# Patient Record
Sex: Male | Born: 1965 | Race: White | Hispanic: No | Marital: Married | Smoking: Former smoker
Health system: Southern US, Community
[De-identification: ages and names within clinical notes are randomized; demographics above are authoritative.]

## PROBLEM LIST (undated history)

## (undated) DIAGNOSIS — I2111 ST elevation (STEMI) myocardial infarction involving right coronary artery: Secondary | ICD-10-CM

## (undated) DIAGNOSIS — E785 Hyperlipidemia, unspecified: Secondary | ICD-10-CM

## (undated) DIAGNOSIS — Z9001 Acquired absence of eye: Secondary | ICD-10-CM

## (undated) DIAGNOSIS — Z955 Presence of coronary angioplasty implant and graft: Secondary | ICD-10-CM

## (undated) HISTORY — DX: Presence of coronary angioplasty implant and graft: Z95.5

## (undated) HISTORY — DX: Acquired absence of eye: Z90.01

## (undated) HISTORY — DX: Hyperlipidemia, unspecified: E78.5

## (undated) HISTORY — DX: ST elevation (STEMI) myocardial infarction involving right coronary artery: I21.11

---

## 2005-01-17 ENCOUNTER — Ambulatory Visit (HOSPITAL_COMMUNITY): Admission: RE | Admit: 2005-01-17 | Discharge: 2005-01-17 | Payer: Self-pay | Admitting: Orthopedic Surgery

## 2005-03-25 ENCOUNTER — Ambulatory Visit (HOSPITAL_COMMUNITY): Admission: RE | Admit: 2005-03-25 | Discharge: 2005-03-25 | Payer: Self-pay | Admitting: Orthopedic Surgery

## 2005-03-25 ENCOUNTER — Ambulatory Visit (HOSPITAL_BASED_OUTPATIENT_CLINIC_OR_DEPARTMENT_OTHER): Admission: RE | Admit: 2005-03-25 | Discharge: 2005-03-25 | Payer: Self-pay | Admitting: Orthopedic Surgery

## 2006-06-09 IMAGING — RF DG ARTHROGRAM WRIST*R*
9 series · 12 of 12 positions shown · IV contrast (omniscan)
Comparison: None.

CLINICAL DATA: 39-year-old who fell and injured his right wrist and has
persistent pain.

RIGHT WRIST ARTHROGRAPHY (SINGLE INJECTION) 01/17/2005:
TECHNIQUE: Informed consent was obtained from the patient prior to the
procedure. Using the usual sterile technique and 1% lidocaine as local
anesthetic, a 25 gauge needle was introduced into the radiocarpal joint using
fluoroscopic guidance. A total of 2 cc of a mixture of 4 cc Hypaque 60, 1 cc 1%
lidocaine, and 3 drops of Omniscan were administered. Images were obtained
before and after exercise.

[Series 1: run · 1 of 1 slices shown (1 of 9)]
[im 1/1]
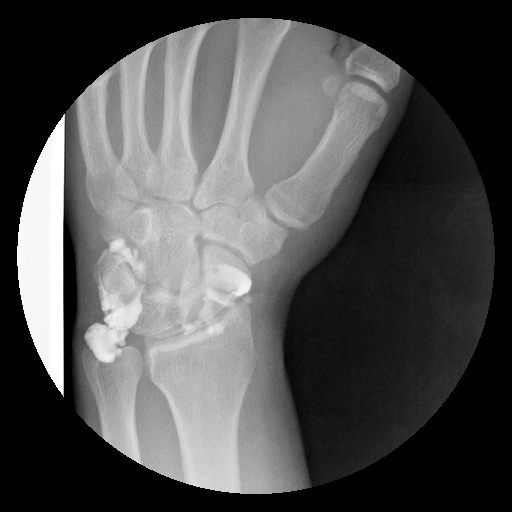

[Series 2: run · 4 of 4 slices shown (2 of 9)]
[im 1/4]
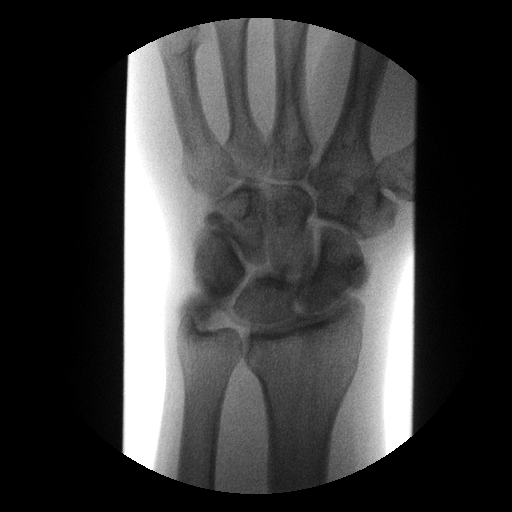
[im 2/4]
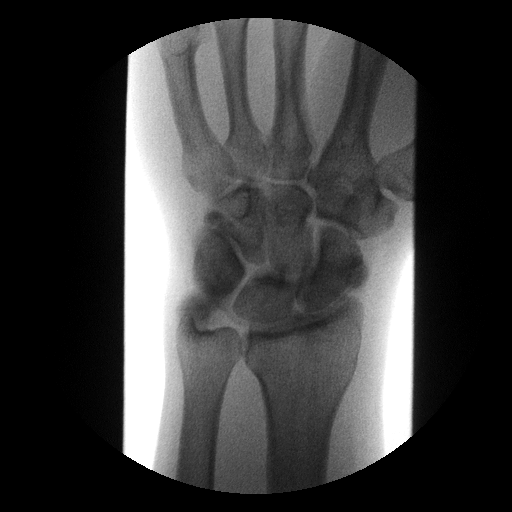
[im 3/4]
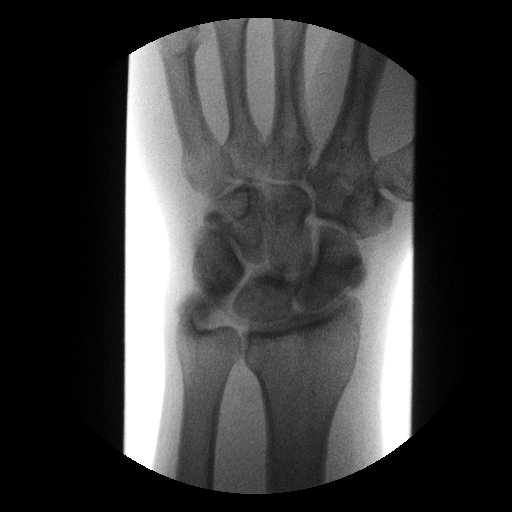
[im 4/4]
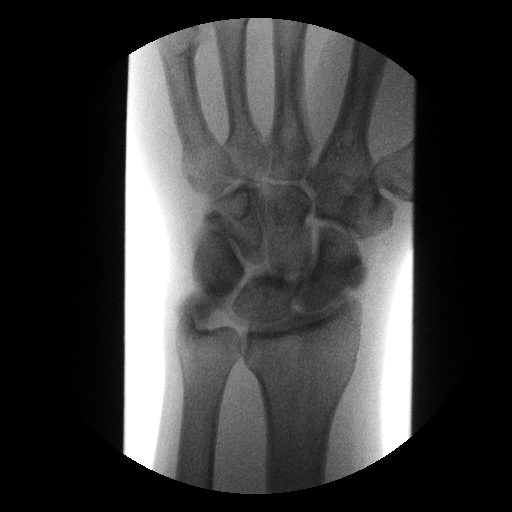

[Series 3: run · 1 of 1 slices shown (3 of 9)]
[im 1/1]
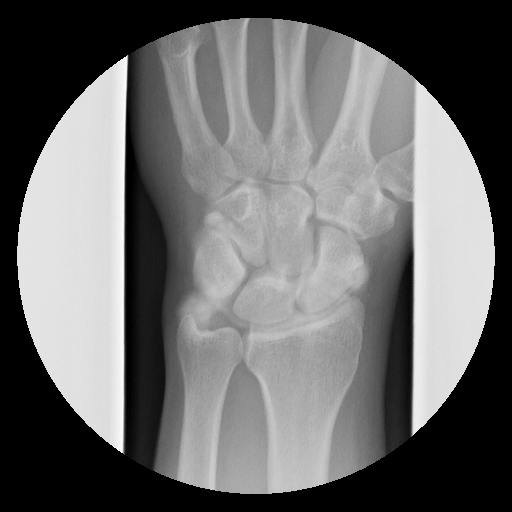

[Series 4: run · 1 of 1 slices shown (4 of 9)]
[im 1/1]
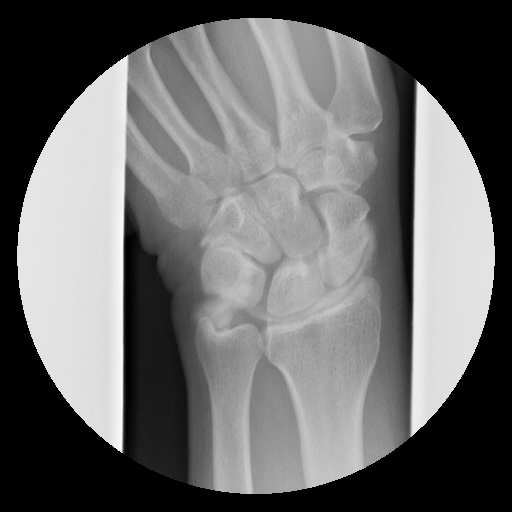

[Series 5: run · 1 of 1 slices shown (5 of 9)]
[im 1/1]
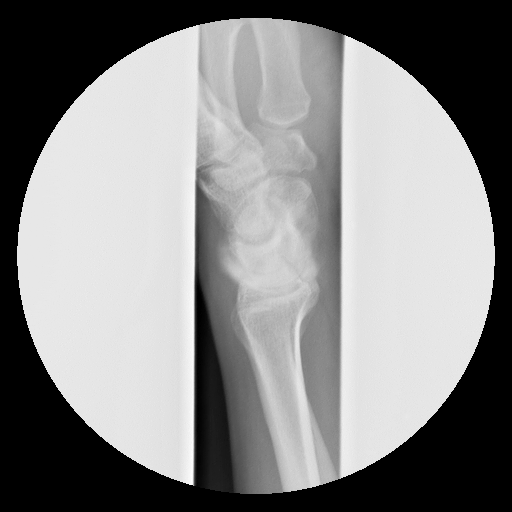

[Series 6: run · 1 of 1 slices shown (6 of 9)]
[im 1/1]
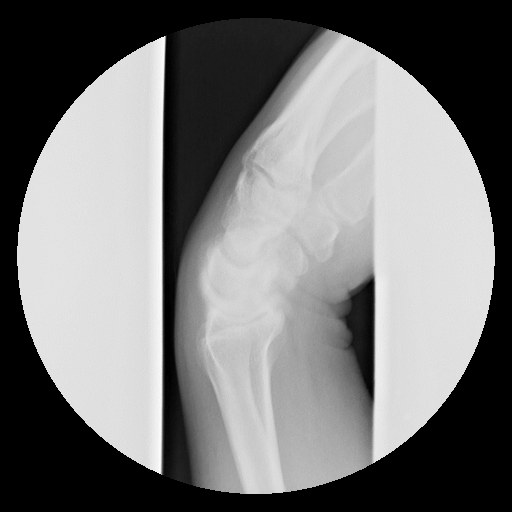

[Series 7: run · 1 of 1 slices shown (7 of 9)]
[im 1/1]
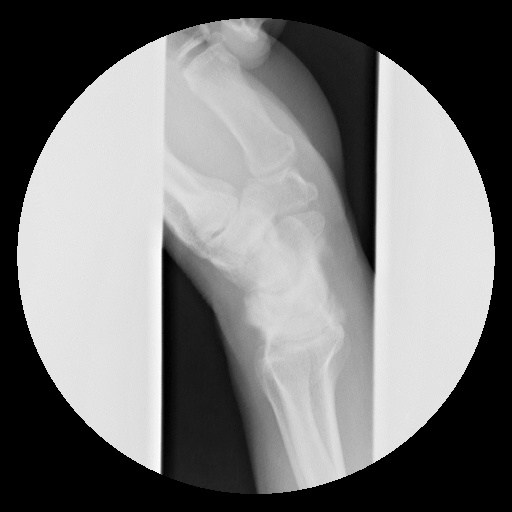

[Series 8: run · 1 of 1 slices shown (8 of 9)]
[im 1/1]
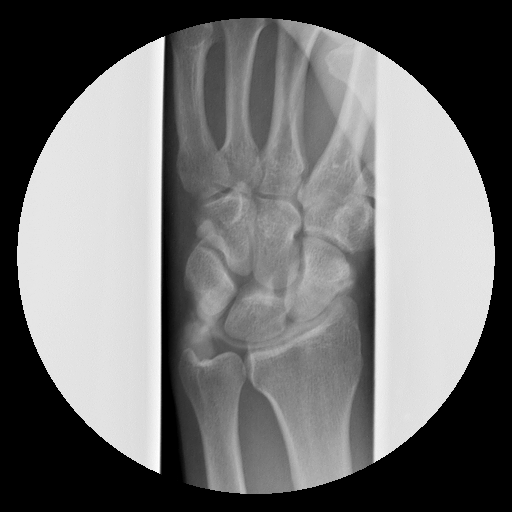

[Series 9: run · 1 of 1 slices shown (9 of 9)]
[im 1/1]
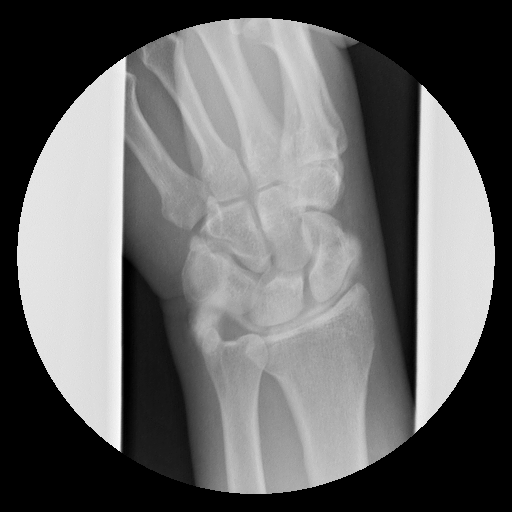

[12 of 12 positions shown; findings below may reference images not displayed]

FINDINGS: originally, this will is supposed to be an injection prior to MRI.
However, upon questioning the patient, he had a history of trauma to the right
eye, and preliminary orbit x-rays showed a large metal fragment in the right
orbit, precluding an MRI examination. He therefore returned to the radiology
department for further imaging of the right wrist.

The injection of the radiocarpal joint shows no evidence of extravasation into
the middle compartment. The triangular fibrocartilage complex appears intact.
There is no widening of the scapholunate space.
IMPRESSION: 1. The intercarpal ligaments and the triangular fibrocartilage complex appear
intact upon injection of the radiocarpal joint.
2. Please see above comments regarding the inability to perform MRI due to the
large metal fragment in the right orbit.

## 2021-08-14 ENCOUNTER — Encounter (HOSPITAL_COMMUNITY): Admission: EM | Disposition: A | Discharge status: Home / Self Care | Payer: Self-pay | Attending: Internal Medicine

## 2021-08-14 ENCOUNTER — Inpatient Hospital Stay (HOSPITAL_COMMUNITY)
Admission: EM | Admit: 2021-08-14 | Discharge: 2021-08-17 | DRG: 246 | Disposition: A | Discharge status: Home / Self Care | Payer: PRIVATE HEALTH INSURANCE | Source: Intra-hospital | Attending: Internal Medicine | Admitting: Internal Medicine

## 2021-08-14 DIAGNOSIS — I442 Atrioventricular block, complete: Secondary | ICD-10-CM | POA: Diagnosis present

## 2021-08-14 DIAGNOSIS — G4730 Sleep apnea, unspecified: Secondary | ICD-10-CM | POA: Diagnosis present

## 2021-08-14 DIAGNOSIS — E785 Hyperlipidemia, unspecified: Secondary | ICD-10-CM | POA: Diagnosis present

## 2021-08-14 DIAGNOSIS — I213 ST elevation (STEMI) myocardial infarction of unspecified site: Secondary | ICD-10-CM | POA: Diagnosis present

## 2021-08-14 DIAGNOSIS — Z720 Tobacco use: Secondary | ICD-10-CM | POA: Diagnosis not present

## 2021-08-14 DIAGNOSIS — Z716 Tobacco abuse counseling: Secondary | ICD-10-CM

## 2021-08-14 DIAGNOSIS — I2111 ST elevation (STEMI) myocardial infarction involving right coronary artery: Secondary | ICD-10-CM | POA: Diagnosis not present

## 2021-08-14 DIAGNOSIS — I110 Hypertensive heart disease with heart failure: Secondary | ICD-10-CM | POA: Diagnosis present

## 2021-08-14 DIAGNOSIS — I2119 ST elevation (STEMI) myocardial infarction involving other coronary artery of inferior wall: Principal | ICD-10-CM | POA: Diagnosis present

## 2021-08-14 DIAGNOSIS — Z955 Presence of coronary angioplasty implant and graft: Secondary | ICD-10-CM

## 2021-08-14 DIAGNOSIS — R001 Bradycardia, unspecified: Secondary | ICD-10-CM | POA: Diagnosis not present

## 2021-08-14 DIAGNOSIS — Z23 Encounter for immunization: Secondary | ICD-10-CM

## 2021-08-14 DIAGNOSIS — I10 Essential (primary) hypertension: Secondary | ICD-10-CM

## 2021-08-14 DIAGNOSIS — F17210 Nicotine dependence, cigarettes, uncomplicated: Secondary | ICD-10-CM | POA: Diagnosis present

## 2021-08-14 DIAGNOSIS — R570 Cardiogenic shock: Secondary | ICD-10-CM | POA: Diagnosis present

## 2021-08-14 DIAGNOSIS — I5033 Acute on chronic diastolic (congestive) heart failure: Secondary | ICD-10-CM | POA: Diagnosis present

## 2021-08-14 DIAGNOSIS — R079 Chest pain, unspecified: Secondary | ICD-10-CM | POA: Diagnosis not present

## 2021-08-14 DIAGNOSIS — I2510 Atherosclerotic heart disease of native coronary artery without angina pectoris: Secondary | ICD-10-CM | POA: Diagnosis not present

## 2021-08-14 HISTORY — PX: CORONARY STENT INTERVENTION: CATH118234

## 2021-08-14 HISTORY — PX: INTRAVASCULAR IMAGING/OCT: CATH118326

## 2021-08-14 HISTORY — PX: LEFT HEART CATH AND CORONARY ANGIOGRAPHY: CATH118249

## 2021-08-14 LAB — POCT I-STAT, CHEM 8
BUN: 22 mg/dL — ABNORMAL HIGH (ref 6–20)
Calcium, Ion: 1.09 mmol/L — ABNORMAL LOW (ref 1.15–1.40)
Chloride: 111 mmol/L (ref 98–111)
Creatinine, Ser: 1.4 mg/dL — ABNORMAL HIGH (ref 0.61–1.24)
Glucose, Bld: 181 mg/dL — ABNORMAL HIGH (ref 70–99)
HCT: 36 % — ABNORMAL LOW (ref 39.0–52.0)
Hemoglobin: 12.2 g/dL — ABNORMAL LOW (ref 13.0–17.0)
Potassium: 4 mmol/L (ref 3.5–5.1)
Sodium: 141 mmol/L (ref 135–145)
TCO2: 20 mmol/L — ABNORMAL LOW (ref 22–32)

## 2021-08-14 SURGERY — LEFT HEART CATH AND CORONARY ANGIOGRAPHY
Anesthesia: LOCAL

## 2021-08-14 MED ORDER — MIDAZOLAM HCL 2 MG/2ML IJ SOLN
INTRAMUSCULAR | Status: AC
  Filled 2021-08-14: qty 2

## 2021-08-14 MED ORDER — ONDANSETRON HCL 4 MG/2ML IJ SOLN: 4.0000 mg | Freq: Four times a day (QID) | INTRAMUSCULAR | Status: DC | PRN

## 2021-08-14 MED ORDER — FENTANYL CITRATE (PF) 100 MCG/2ML IJ SOLN
INTRAMUSCULAR | Status: DC | PRN
  Administered 2021-08-14 (×2): 25 ug via INTRAVENOUS

## 2021-08-14 MED ORDER — TIROFIBAN (AGGRASTAT) BOLUS VIA INFUSION
INTRAVENOUS | Status: DC | PRN
  Administered 2021-08-14: 2575 ug via INTRAVENOUS

## 2021-08-14 MED ORDER — ENOXAPARIN SODIUM 40 MG/0.4ML IJ SOSY
40.0000 mg | PREFILLED_SYRINGE | Freq: Every day | INTRAMUSCULAR | Status: DC
  Administered 2021-08-15 – 2021-08-17 (×3): 40 mg via SUBCUTANEOUS
  Filled 2021-08-14 (×3): qty 0.4

## 2021-08-14 MED ORDER — VERAPAMIL HCL 2.5 MG/ML IV SOLN
INTRAVENOUS | Status: DC | PRN
  Administered 2021-08-14: 5 mL via INTRA_ARTERIAL

## 2021-08-14 MED ORDER — NITROGLYCERIN 0.4 MG SL SUBL: 0.4000 mg | SUBLINGUAL_TABLET | SUBLINGUAL | Status: DC | PRN

## 2021-08-14 MED ORDER — EPINEPHRINE 1 MG/10ML IJ SOSY
PREFILLED_SYRINGE | INTRAMUSCULAR | Status: DC | PRN
  Administered 2021-08-14: 1 mg via INTRAVENOUS

## 2021-08-14 MED ORDER — TIROFIBAN HCL IV 12.5 MG/250 ML
INTRAVENOUS | Status: DC | PRN
  Administered 2021-08-14: .15 ug/kg/min via INTRAVENOUS

## 2021-08-14 MED ORDER — MIDAZOLAM HCL 2 MG/2ML IJ SOLN
INTRAMUSCULAR | Status: DC | PRN
  Administered 2021-08-14 (×2): 1 mg via INTRAVENOUS

## 2021-08-14 MED ORDER — ACETAMINOPHEN 325 MG PO TABS: 650.0000 mg | ORAL_TABLET | ORAL | Status: DC | PRN

## 2021-08-14 MED ORDER — ATROPINE SULFATE 1 MG/10ML IJ SOSY
PREFILLED_SYRINGE | INTRAMUSCULAR | Status: DC | PRN
  Administered 2021-08-14: .5 mg via INTRAVENOUS

## 2021-08-14 MED ORDER — ASPIRIN EC 81 MG PO TBEC: 81.0000 mg | DELAYED_RELEASE_TABLET | Freq: Every day | ORAL | Status: DC

## 2021-08-14 MED ORDER — HEPARIN SODIUM (PORCINE) 1000 UNIT/ML IJ SOLN
INTRAMUSCULAR | Status: DC | PRN
  Administered 2021-08-14: 3000 [IU] via INTRAVENOUS
  Administered 2021-08-14: 7000 [IU] via INTRAVENOUS

## 2021-08-14 MED ORDER — ATORVASTATIN CALCIUM 80 MG PO TABS
80.0000 mg | ORAL_TABLET | Freq: Every day | ORAL | Status: DC
  Administered 2021-08-15 – 2021-08-17 (×3): 80 mg via ORAL
  Filled 2021-08-14 (×3): qty 1

## 2021-08-14 MED ORDER — FENTANYL CITRATE (PF) 100 MCG/2ML IJ SOLN
INTRAMUSCULAR | Status: AC
  Filled 2021-08-14: qty 2

## 2021-08-14 MED ORDER — ONDANSETRON HCL 4 MG/2ML IJ SOLN
INTRAMUSCULAR | Status: DC | PRN
  Administered 2021-08-14: 4 mg via INTRAVENOUS

## 2021-08-14 MED ORDER — LIDOCAINE HCL (PF) 1 % IJ SOLN
INTRAMUSCULAR | Status: DC | PRN
  Administered 2021-08-14: 15 mL via INTRADERMAL

## 2021-08-14 MED ORDER — TICAGRELOR 90 MG PO TABS
90.0000 mg | ORAL_TABLET | Freq: Two times a day (BID) | ORAL | Status: DC
Start: 2021-08-15 — End: 2021-08-17
  Administered 2021-08-15 – 2021-08-17 (×5): 90 mg via ORAL
  Filled 2021-08-14 (×5): qty 1

## 2021-08-14 SURGICAL SUPPLY — 28 items
BALLN SAPPHIRE 2.5X12 (BALLOONS) ×2
BALLN SAPPHIRE ~~LOC~~ 4.0X15 (BALLOONS) ×1 IMPLANT
BALLOON SAPPHIRE 2.5X12 (BALLOONS) IMPLANT
CABLE ADAPT PACING TEMP 12FT (ADAPTER) ×1 IMPLANT
CATH DIAG 6FR PIGTAIL ANGLED (CATHETERS) ×1 IMPLANT
CATH DRAGONFLY OPTIS 2.7FR (CATHETERS) ×2 IMPLANT
CATH GUIDEZILLA II 6F (CATHETERS) IMPLANT
CATH INFINITI 6F FL3.5 (CATHETERS) ×1 IMPLANT
CATH LAUNCHER 6FR JR4 (CATHETERS) ×1 IMPLANT
CATH S G BIP PACING (CATHETERS) ×1 IMPLANT
CATHETER GUIDEZILLA II 6F (CATHETERS) ×2
CLOSURE PERCLOSE PROSTYLE (VASCULAR PRODUCTS) ×1 IMPLANT
DEVICE RAD COMP TR BAND LRG (VASCULAR PRODUCTS) ×1 IMPLANT
ELECT DEFIB PAD ADLT CADENCE (PAD) ×1 IMPLANT
GLIDESHEATH SLEND SS 6F .021 (SHEATH) ×1 IMPLANT
GUIDEWIRE INQWIRE 1.5J.035X260 (WIRE) IMPLANT
GUIDEWIRE VAS SION BLUE 190 (WIRE) ×1 IMPLANT
INQWIRE 1.5J .035X260CM (WIRE) ×2
KIT ENCORE 26 ADVANTAGE (KITS) ×1 IMPLANT
KIT HEART LEFT (KITS) ×2 IMPLANT
PACK CARDIAC CATHETERIZATION (CUSTOM PROCEDURE TRAY) ×2 IMPLANT
SHEATH PINNACLE 6F 10CM (SHEATH) ×1 IMPLANT
SHEATH PROBE COVER 6X72 (BAG) ×1 IMPLANT
STENT ONYX FRONTIER 3.0X18 (Permanent Stent) ×1 IMPLANT
STENT ONYX FRONTIER 4.0X12 (Permanent Stent) ×1 IMPLANT
TRANSDUCER W/STOPCOCK (MISCELLANEOUS) ×2 IMPLANT
TUBING CIL FLEX 10 FLL-RA (TUBING) ×2 IMPLANT
WIRE MICRO SET SILHO 5FR 7 (SHEATH) ×1 IMPLANT

## 2021-08-14 NOTE — H&P (Addendum)
?Cardiology Admission History and Physical:  ? ?Patient ID: Luis Foster ?MRN: [id number]; DOB: 23-Jul-1965  ? ?Admission date: 08/14/2021 ? ?PCP:  No primary care provider on file. ?  ?[facility] Providers ?Cardiologist:  None      ? ? ?Chief Complaint:  STEMI ? ?Patient Profile:  ? ?Luis Foster is a 56 y.o. male with active tobacco use, longstanding 1.5 ppd smoking years, hyperlipidemia with last LDL 170 not on statin therapy, who is being seen 08/14/2021 for the evaluation of STEMI. ? ?History of Present Illness:  ? ?Mr. Luis Foster presented with acute onset chest pain for 1 hour prior to presentation. Had inferior ST elevations with reciprocal anterolateral ST depressions consistent with inferior STEMI. Developed complete heart block, sinus rate 120 bpm and ventricular rate 50 bpm. Aspirin, 4000U heparin. Received atropine and intra-procedural epinephrine for bradyarrhythmia ?Temporary pacemaker placed right femoral vein. Right radial access obtained where LHC revealed dLM 40%, nonobstructive LAD/LCx. mRCA 100% occlusion culprit lesion with moderate thrombus burden in mRCA, dRCA, rPDA. 1 DES to [facility]. ?Requiring levophed 10 at completion of procedure ? ?No past medical history on file. ? ?  ? ?Medications Prior to Admission: ?Prior to Admission medications   ?Not on File  ?  ? ?Allergies:   Not on File ? ?Social History:   ?Social History  ? ?Socioeconomic History  ? Marital status: Married  ?  Spouse name: Not on file  ? Number of children: Not on file  ? Years of education: Not on file  ? Highest education level: Not on file  ?Occupational History  ? Not on file  ?Tobacco Use  ? Smoking status: Not on file  ? Smokeless tobacco: Not on file  ?Substance and Sexual Activity  ? Alcohol use: Not on file  ? Drug use: Not on file  ? Sexual activity: Not on file  ?Other Topics Concern  ? Not on file  ?Social History Narrative  ? Not on file  ? ?Social Determinants of Health  ? ?Financial Resource Strain: Not on file   ?Food Insecurity: Not on file  ?Transportation Needs: Not on file  ?Physical Activity: Not on file  ?Stress: Not on file  ?Social Connections: Not on file  ?Intimate Partner Violence: Not on file  ?  ?Family History:   ?The patient's family history is not on file.   ? ?ROS:  ?Please see the history of present illness.  ?All other ROS reviewed and negative.    ? ?Physical Exam/Data:  ?There were no vitals filed for this visit. ?No intake or output data in the 24 hours ending 08/14/21 2322 ?   ? View : No data to display.  ?  ?  ?  ?   ?There is no height or weight on file to calculate BMI.  ?General:  Well nourished, well developed, no CP ?HEENT: normal ?Neck: no JVD ?Vascular: No carotid bruits; Distal pulses 2+ bilaterally   ?Cardiac:  normal S1, S2; RRR; no murmur  ?Lungs:  clear to auscultation bilaterally, no wheezing, rhonchi or rales  ?Abd: soft, nontender, no hepatomegaly  ?Ext: no edema. TR band R rad; r fem no hematoma or mass or bleeding  ?Musculoskeletal:  No deformities, BUE and BLE strength normal and equal ?Skin: warm and dry  ?Neuro:  CNs 2-12 intact, no focal abnormalities noted ?Psych:  Normal affect  ? ? ?EKG:  The ECG that was done  was personally reviewed and demonstrates inferior St elevations, reciprocal St depressions  anterolateral, complete heart block with junctional escape rhythm ? ?Relevant CV Studies: ? ? ?Laboratory Data: ? ?High Sensitivity Troponin:  No results for input(s): TROPONINIHS in the last 720 hours.    ?ChemistryNo results for input(s): NA, K, CL, CO2, GLUCOSE, BUN, CREATININE, CALCIUM, MG, GFRNONAA, GFRAA, ANIONGAP in the last 168 hours.  ?No results for input(s): PROT, ALBUMIN, AST, ALT, ALKPHOS, BILITOT in the last 168 hours. ?Lipids No results for input(s): CHOL, TRIG, HDL, LABVLDL, LDLCALC, CHOLHDL in the last 168 hours. ?HematologyNo results for input(s): WBC, RBC, HGB, HCT, MCV, MCH, MCHC, RDW, PLT in the last 168 hours. ?Thyroid No results for input(s): TSH, FREET4  in the last 168 hours. ?BNPNo results for input(s): BNP, PROBNP in the last 168 hours.  ?DDimer No results for input(s): DDIMER in the last 168 hours. ? ? ?Radiology/Studies:  ?No results found. ? ? ?Assessment and Plan:  ? ?RCA STEMI: S/p DES Aspirin, ticagrelor. DAPT 6-12 months then aspirin monotherapy. Lipitor 80 daily. Hold BB since was in [facility] but plan to start in AM. TTE in AM to assess VL/RV function. CXR to assess Killip class. Cardiac rehab evaluation as inpatient and start rehab on discharge. Smoking cessation ?Shock, cardiogenic: 2/2 RCA STEMI. May have some RV injury component. Trend lactic acid. Low threshold for IJ CVL to determine mixed venous. Gentle fluids if needed for preload support given RCA injury ?[facility], transient: Due to RCA STEMI, since resolved. TVP right femoral removed, perclose site no hematoma. No indications for permanent PPM ?HLD: Lipitor 80 as above. Repeat lipid panel and if LDL remains > 150 then add zetia since unlikely to achieve goal LDL < 70 on statin therapy alone.  ?Tobacco use: discussed importance of long-term tobacco cessation. Nicotine patch as inpatient if needed. Consider chantix on discharge  ? ? ?Risk Assessment/Risk Scores:  ?  ?TIMI Risk Score for ST  Elevation MI:   ?The patient's TIMI risk score is 0 , which indicates a  % risk of all cause mortality at 30 days.  ?  ?  ? ? ?Severity of Illness: ?The appropriate patient status for this patient is INPATIENT. Inpatient status is judged to be reasonable and necessary in order to provide the required intensity of service to ensure the patient's safety. The patient's presenting symptoms, physical exam findings, and initial radiographic and laboratory data in the context of their chronic comorbidities is felt to place them at high risk for further clinical deterioration. Furthermore, it is not anticipated that the patient will be medically stable for discharge from the hospital within 2 midnights of admission.  ? ?* I  certify that at the point of admission it is my clinical judgment that the patient will require inpatient hospital care spanning beyond 2 midnights from the point of admission due to high intensity of service, high risk for further deterioration and high frequency of surveillance required.*  ? ?For questions or updates, please contact [facility] ?Please consult [email or web address] for contact info under  ? ?  ?Signed, ?[place]il[place]Jordan Tannenbaum, MD  ?08/14/2021 11:22 PM   ? ?ATTENDING ATTESTATION: ? ?After conducting a review of all available clinical information with the care team, interviewing the patient, and performing a physical exam, I agree with the findings and plan described in this note. ?  ?GEN: No acute distress.   ?Cardiac: RRR, no murmurs, rubs, or gallops.  ?Respiratory: Clear to auscultation bilaterally. ?GI: Soft, nontender, non-distended  ?MS: No edema; No deformity. ?Neuro:  Nonfocal  ?Vasc:  +2  radial pulses ? ?Patient is a 56 year old smoker who presents with an inferior ST elevation myocardial infarction.  He underwent successful PCI of the right coronary artery with 2 overlapping drug-eluting stents and was w.  Given large RV infarction we will hold beta-blocker acutely and consider starting at discharge.  Continue dual antiplatelet therapy, as needed nitroglycerin, and high-dose atorvastatin.  Obtain echocardiogram and encourage smoking cessation. ? ?Alverda Skeans, MD ?Pager [phone] ? ?

## 2021-08-15 ENCOUNTER — Inpatient Hospital Stay (HOSPITAL_COMMUNITY): Payer: PRIVATE HEALTH INSURANCE

## 2021-08-15 DIAGNOSIS — I213 ST elevation (STEMI) myocardial infarction of unspecified site: Secondary | ICD-10-CM

## 2021-08-15 DIAGNOSIS — R079 Chest pain, unspecified: Secondary | ICD-10-CM

## 2021-08-15 DIAGNOSIS — I2111 ST elevation (STEMI) myocardial infarction involving right coronary artery: Secondary | ICD-10-CM

## 2021-08-15 LAB — BASIC METABOLIC PANEL
Anion gap: 7 (ref 5–15)
BUN: 21 mg/dL — ABNORMAL HIGH (ref 6–20)
CO2: 21 mmol/L — ABNORMAL LOW (ref 22–32)
Calcium: 8.3 mg/dL — ABNORMAL LOW (ref 8.9–10.3)
Chloride: 111 mmol/L (ref 98–111)
Creatinine, Ser: 1.4 mg/dL — ABNORMAL HIGH (ref 0.61–1.24)
GFR, Estimated: 59 mL/min — ABNORMAL LOW (ref >60)
Glucose, Bld: 149 mg/dL — ABNORMAL HIGH (ref 70–99)
Potassium: 4.1 mmol/L (ref 3.5–5.1)
Sodium: 139 mmol/L (ref 135–145)

## 2021-08-15 LAB — CBC
HCT: 37.5 % — ABNORMAL LOW (ref 39.0–52.0)
HCT: 42.7 % (ref 39.0–52.0)
Hemoglobin: 13.5 g/dL (ref 13.0–17.0)
Hemoglobin: 14.5 g/dL (ref 13.0–17.0)
MCH: 34.1 pg — ABNORMAL HIGH (ref 26.0–34.0)
MCH: 30.9 pg (ref 26.0–34.0)
MCHC: 36 g/dL (ref 30.0–36.0)
MCHC: 34 g/dL (ref 30.0–36.0)
MCV: 94.7 fL (ref 80.0–100.0)
MCV: 91 fL (ref 80.0–100.0)
Platelets: 220 10*3/uL (ref 150–400)
Platelets: 235 10*3/uL (ref 150–400)
RBC: 3.96 MIL/uL — ABNORMAL LOW (ref 4.22–5.81)
RBC: 4.69 MIL/uL (ref 4.22–5.81)
RDW: 12.3 % (ref 11.5–15.5)
RDW: 13.2 % (ref 11.5–15.5)
WBC: 18.6 10*3/uL — ABNORMAL HIGH (ref 4.0–10.5)
WBC: 16.8 10*3/uL — ABNORMAL HIGH (ref 4.0–10.5)
nRBC: 0 % (ref 0.0–0.2)
nRBC: 0 % (ref 0.0–0.2)

## 2021-08-15 LAB — DIFFERENTIAL
Abs Immature Granulocytes: 0.09 10*3/uL — ABNORMAL HIGH (ref 0.00–0.07)
Basophils Absolute: 0.1 10*3/uL (ref 0.0–0.1)
Basophils Relative: 0 %
Eosinophils Absolute: 0.1 10*3/uL (ref 0.0–0.5)
Eosinophils Relative: 0 %
Immature Granulocytes: 1 %
Lymphocytes Relative: 20 %
Lymphs Abs: 3.7 10*3/uL (ref 0.7–4.0)
Monocytes Absolute: 1.1 10*3/uL — ABNORMAL HIGH (ref 0.1–1.0)
Monocytes Relative: 6 %
Neutro Abs: 13.6 10*3/uL — ABNORMAL HIGH (ref 1.7–7.7)
Neutrophils Relative %: 73 %

## 2021-08-15 LAB — COMPREHENSIVE METABOLIC PANEL
ALT: 21 U/L (ref 0–44)
AST: 24 U/L (ref 15–41)
Albumin: 3.7 g/dL (ref 3.5–5.0)
Alkaline Phosphatase: 79 U/L (ref 38–126)
Anion gap: 13 (ref 5–15)
BUN: 21 mg/dL — ABNORMAL HIGH (ref 6–20)
CO2: 17 mmol/L — ABNORMAL LOW (ref 22–32)
Calcium: 7.9 mg/dL — ABNORMAL LOW (ref 8.9–10.3)
Chloride: 109 mmol/L (ref 98–111)
Creatinine, Ser: 1.45 mg/dL — ABNORMAL HIGH (ref 0.61–1.24)
GFR, Estimated: 56 mL/min — ABNORMAL LOW (ref >60)
Glucose, Bld: 165 mg/dL — ABNORMAL HIGH (ref 70–99)
Potassium: 5.4 mmol/L — ABNORMAL HIGH (ref 3.5–5.1)
Sodium: 139 mmol/L (ref 135–145)
Total Bilirubin: 1 mg/dL (ref 0.3–1.2)
Total Protein: 6 g/dL — ABNORMAL LOW (ref 6.5–8.1)

## 2021-08-15 LAB — POCT ACTIVATED CLOTTING TIME
Activated Clotting Time: 269 seconds
Activated Clotting Time: 335 seconds

## 2021-08-15 LAB — ECHOCARDIOGRAM COMPLETE
Area-P 1/2: 2.37 cm2
Calc EF: 41.9 %
S' Lateral: 3.9 cm
Single Plane A2C EF: 46.8 %
Single Plane A4C EF: 48.3 %

## 2021-08-15 LAB — LACTIC ACID, PLASMA: Lactic Acid, Venous: 1.9 mmol/L (ref 0.5–1.9)

## 2021-08-15 LAB — LIPID PANEL
Cholesterol: 188 mg/dL (ref 0–200)
Cholesterol: 190 mg/dL (ref 0–200)
HDL: 35 mg/dL — ABNORMAL LOW (ref >40)
HDL: 40 mg/dL — ABNORMAL LOW (ref >40)
LDL Cholesterol: 114 mg/dL — ABNORMAL HIGH (ref 0–99)
LDL Cholesterol: 140 mg/dL — ABNORMAL HIGH (ref 0–99)
Total CHOL/HDL Ratio: 5.4 RATIO
Total CHOL/HDL Ratio: 4.8 RATIO
Triglycerides: 197 mg/dL — ABNORMAL HIGH (ref <150)
Triglycerides: 48 mg/dL (ref <150)
VLDL: 39 mg/dL (ref 0–40)
VLDL: 10 mg/dL (ref 0–40)

## 2021-08-15 LAB — HEMOGLOBIN A1C
Hgb A1c MFr Bld: 5.7 % — ABNORMAL HIGH (ref 4.8–5.6)
Mean Plasma Glucose: 116.89 mg/dL

## 2021-08-15 LAB — PROTIME-INR
INR: 1.2 (ref 0.8–1.2)
Prothrombin Time: 15.3 seconds — ABNORMAL HIGH (ref 11.4–15.2)

## 2021-08-15 LAB — TSH: TSH: 1.944 u[IU]/mL (ref 0.350–4.500)

## 2021-08-15 LAB — MAGNESIUM: Magnesium: 1.8 mg/dL (ref 1.7–2.4)

## 2021-08-15 LAB — BRAIN NATRIURETIC PEPTIDE: B Natriuretic Peptide: 14.3 pg/mL (ref 0.0–100.0)

## 2021-08-15 LAB — TROPONIN I (HIGH SENSITIVITY): Troponin I (High Sensitivity): 366 ng/L (ref <18)

## 2021-08-15 LAB — MRSA NEXT GEN BY PCR, NASAL: MRSA by PCR Next Gen: NOT DETECTED

## 2021-08-15 LAB — APTT: aPTT: >200 seconds (ref 24–36)

## 2021-08-15 LAB — T4, FREE: Free T4: 1.05 ng/dL (ref 0.61–1.12)

## 2021-08-15 MED ORDER — EPINEPHRINE 1 MG/10ML IJ SOSY
PREFILLED_SYRINGE | INTRAMUSCULAR | Status: AC
  Filled 2021-08-15: qty 10

## 2021-08-15 MED ORDER — SODIUM CHLORIDE 0.9% FLUSH: 3.0000 mL | INTRAVENOUS | Status: DC | PRN

## 2021-08-15 MED ORDER — NOREPINEPHRINE 4 MG/250ML-% IV SOLN
INTRAVENOUS | Status: AC | PRN
  Administered 2021-08-15: 10 ug/min via INTRAVENOUS

## 2021-08-15 MED ORDER — VERAPAMIL HCL 2.5 MG/ML IV SOLN
INTRAVENOUS | Status: AC
  Filled 2021-08-15: qty 2

## 2021-08-15 MED ORDER — SODIUM CHLORIDE 0.9% FLUSH
3.0000 mL | Freq: Two times a day (BID) | INTRAVENOUS | Status: DC
  Administered 2021-08-15 – 2021-08-17 (×3): 3 mL via INTRAVENOUS

## 2021-08-15 MED ORDER — ASPIRIN 81 MG PO CHEW
81.0000 mg | CHEWABLE_TABLET | Freq: Every day | ORAL | Status: DC
  Administered 2021-08-15 – 2021-08-17 (×3): 81 mg via ORAL
  Filled 2021-08-15 (×3): qty 1

## 2021-08-15 MED ORDER — LIDOCAINE HCL (PF) 1 % IJ SOLN
INTRAMUSCULAR | Status: AC
  Filled 2021-08-15: qty 30

## 2021-08-15 MED ORDER — SODIUM CHLORIDE 0.9 % IV SOLN: 250.0000 mL | INTRAVENOUS | Status: DC | PRN

## 2021-08-15 MED ORDER — ATROPINE SULFATE 1 MG/10ML IJ SOSY
PREFILLED_SYRINGE | INTRAMUSCULAR | Status: AC
  Filled 2021-08-15: qty 10

## 2021-08-15 MED ORDER — HEPARIN SODIUM (PORCINE) 1000 UNIT/ML IJ SOLN
INTRAMUSCULAR | Status: AC
  Filled 2021-08-15: qty 10

## 2021-08-15 MED ORDER — NICOTINE 21 MG/24HR TD PT24
21.0000 mg | MEDICATED_PATCH | Freq: Every day | TRANSDERMAL | Status: DC
  Administered 2021-08-15 – 2021-08-17 (×3): 21 mg via TRANSDERMAL
  Filled 2021-08-15 (×3): qty 1

## 2021-08-15 MED ORDER — NOREPINEPHRINE 4 MG/250ML-% IV SOLN
0.0000 ug/min | INTRAVENOUS | Status: DC
  Administered 2021-08-15: 1 ug/min via INTRAVENOUS

## 2021-08-15 MED ORDER — ONDANSETRON HCL 4 MG/2ML IJ SOLN
INTRAMUSCULAR | Status: AC
  Filled 2021-08-15: qty 2

## 2021-08-15 MED ORDER — PERFLUTREN LIPID MICROSPHERE
1.0000 mL | INTRAVENOUS | Status: AC | PRN
  Administered 2021-08-15: 3 mL via INTRAVENOUS
  Filled 2021-08-15: qty 10

## 2021-08-15 MED ORDER — ACETAMINOPHEN 325 MG PO TABS: 650.0000 mg | ORAL_TABLET | ORAL | Status: DC | PRN

## 2021-08-15 MED ORDER — TICAGRELOR 90 MG PO TABS
ORAL_TABLET | ORAL | Status: DC | PRN
  Administered 2021-08-15: 180 mg via ORAL

## 2021-08-15 MED ORDER — PNEUMOCOCCAL 20-VAL CONJ VACC 0.5 ML IM SUSY
0.5000 mL | PREFILLED_SYRINGE | INTRAMUSCULAR | Status: DC
  Filled 2021-08-15: qty 0.5

## 2021-08-15 MED ORDER — FENTANYL CITRATE (PF) 100 MCG/2ML IJ SOLN
INTRAMUSCULAR | Status: DC | PRN
  Administered 2021-08-14: 25 ug via INTRAVENOUS

## 2021-08-15 MED ORDER — HYDRALAZINE HCL 20 MG/ML IJ SOLN: 10.0000 mg | INTRAMUSCULAR | Status: AC | PRN

## 2021-08-15 MED ORDER — CHLORHEXIDINE GLUCONATE CLOTH 2 % EX PADS: 6.0000 | MEDICATED_PAD | Freq: Every day | CUTANEOUS | Status: DC

## 2021-08-15 MED ORDER — SODIUM CHLORIDE 0.9 % IV SOLN: INTRAVENOUS | Status: AC

## 2021-08-15 MED ORDER — TICAGRELOR 90 MG PO TABS
ORAL_TABLET | ORAL | Status: AC
  Filled 2021-08-15: qty 2

## 2021-08-15 MED ORDER — TICAGRELOR 90 MG PO TABS: 90.0000 mg | ORAL_TABLET | Freq: Two times a day (BID) | ORAL | Status: DC

## 2021-08-15 MED ORDER — ORAL CARE MOUTH RINSE
15.0000 mL | Freq: Two times a day (BID) | OROMUCOSAL | Status: DC
  Administered 2021-08-15 – 2021-08-17 (×5): 15 mL via OROMUCOSAL

## 2021-08-15 MED ORDER — ONDANSETRON HCL 4 MG/2ML IJ SOLN: 4.0000 mg | Freq: Four times a day (QID) | INTRAMUSCULAR | Status: DC | PRN

## 2021-08-15 MED ORDER — LABETALOL HCL 5 MG/ML IV SOLN: 10.0000 mg | INTRAVENOUS | Status: AC | PRN

## 2021-08-15 MED ORDER — MIDAZOLAM HCL 2 MG/2ML IJ SOLN
INTRAMUSCULAR | Status: DC | PRN
  Administered 2021-08-14: 1 mg via INTRAVENOUS

## 2021-08-15 MED ORDER — LIDOCAINE HCL (PF) 1 % IJ SOLN
INTRAMUSCULAR | Status: DC | PRN
  Administered 2021-08-14: 2 mL via INTRADERMAL

## 2021-08-15 MED ORDER — CHLORHEXIDINE GLUCONATE CLOTH 2 % EX PADS
6.0000 | MEDICATED_PAD | Freq: Every day | CUTANEOUS | Status: DC
  Administered 2021-08-15 (×2): 6 via TOPICAL

## 2021-08-15 NOTE — Progress Notes (Signed)
?  Echocardiogram ?2D Echocardiogram has been performed. ? ?Luis Foster ?08/15/2021, 8:46 AM ?

## 2021-08-15 NOTE — Progress Notes (Signed)
? ?Progress Note ? ?Patient Name: Luis Foster ?Date of Encounter: 08/15/2021 ? ?Primary Cardiologist: None  ? ? ? ?Patient Profile  ?   ?56 y.o. male admitted 3/25 with inferior STEMI with RV involvement complicated by complete heart block, requiring temporary transvenous pacing ?LHC >> RCAm -100>> DES x2 ; complicated by hypotension requiring Levophed ? ?Subjective  ? ?No mchest pain  ? ?Inpatient Medications  ?  ?Scheduled Meds: ? aspirin  81 mg Oral Daily  ? atorvastatin  80 mg Oral Daily  ? Chlorhexidine Gluconate Cloth  6 each Topical Daily  ? enoxaparin (LOVENOX) injection  40 mg Subcutaneous Daily  ? mouth rinse  15 mL Mouth Rinse BID  ? [START ON 08/16/2021] pneumococcal 20-valent conjugate vaccine  0.5 mL Intramuscular Tomorrow-1000  ? sodium chloride flush  3 mL Intravenous Q12H  ? ticagrelor  90 mg Oral BID  ? ?Continuous Infusions: ? sodium chloride    ? norepinephrine (LEVOPHED) Adult infusion Stopped (08/15/21 0226)  ? ?PRN Meds: ?sodium chloride, acetaminophen, nitroGLYCERIN, ondansetron (ZOFRAN) IV, perflutren lipid microspheres (DEFINITY) IV suspension, sodium chloride flush  ? ?Vital Signs  ?  ?Vitals:  ? 08/15/21 0600 08/15/21 0700 08/15/21 0800 08/15/21 0900  ?BP: 102/73 114/69 113/71 108/67  ?Pulse: 63 74 (!) 59 (!) 56  ?Resp: (!) 26 (!) 21 (!) 26 (!) 29  ?Temp:   98.2 ?F (36.8 ?C)   ?TempSrc:   Oral   ?SpO2: 96% 96% 98% 98%  ? ? ?Intake/Output Summary (Last 24 hours) at 08/15/2021 0943 ?Last data filed at 08/15/2021 0800 ?Gross per 24 hour  ?Intake 607.2 ml  ?Output 100 ml  ?Net 507.2 ml  ? ?There were no vitals filed for this visit. ? ?Telemetry  ?  ?Sinus with nonsustained VT- Personally Reviewed ?Sinus slowing with junctional bradycardia ? ?ECG  ?  ?3/26- Personally Reviewed minimal residual ST elevation in lead III ? ?Physical Exam  ?  ?GEN: No acute distress.   ?Neck: No JVD ?Cardiac: RRR, no murmurs, rubs, or gallops.  ?Respiratory: Clear to auscultation bilaterally. ?GI: Soft,  nontender, non-distended  ?MS: No edema; No deformity. ?Right hand with good capillary refill ?Neuro:  Nonfocal  ?Psych: Normal affect  ? ?Labs  ?  ?Chemistry ?Recent Labs  ?Lab 08/14/21 ?2330 08/15/21 ?0059 08/15/21 ?0148  ?NA 141 139 139  ?K 4.0 5.4* 4.1  ?CL 111 109 111  ?CO2  --  17* 21*  ?GLUCOSE 181* 165* 149*  ?BUN 22* 21* 21*  ?CREATININE 1.40* 1.45* 1.40*  ?CALCIUM  --  7.9* 8.3*  ?PROT  --  6.0*  --   ?ALBUMIN  --  3.7  --   ?AST  --  24  --   ?ALT  --  21  --   ?ALKPHOS  --  79  --   ?BILITOT  --  1.0  --   ?GFRNONAA  --  56* 59*  ?ANIONGAP  --  13 7  ?  ? ?Hematology ?Recent Labs  ?Lab 08/14/21 ?2330 08/15/21 ?0059 08/15/21 ?0148  ?WBC  --  18.6* 16.8*  ?RBC  --  3.96* 4.69  ?HGB 12.2* 13.5 14.5  ?HCT 36.0* 37.5* 42.7  ?MCV  --  94.7 91.0  ?MCH  --  34.1* 30.9  ?MCHC  --  36.0 34.0  ?RDW  --  12.3 13.2  ?PLT  --  220 235  ? ? ?Cardiac EnzymesNo results for input(s): TROPONINI in the last 168 hours. No results for input(s):  TROPIPOC in the last 168 hours.  ? ?BNP ?Recent Labs  ?Lab 08/15/21 ?0059  ?BNP 14.3  ?  ? ?DDimer No results for input(s): DDIMER in the last 168 hours.  ? ?Radiology  ?  ?CARDIAC CATHETERIZATION ? ?Result Date: 08/15/2021 ?  Mid LM lesion is 10% stenosed.   1st Diag lesion is 30% stenosed.   Ost LAD lesion is 20% stenosed.   Mid RCA lesion is 100% stenosed.   A stent was successfully placed.   Post intervention, there is a 0% residual stenosis.   LV end diastolic pressure is normal.   The left ventricular ejection fraction is 50-55% by visual estimate. 1.  100% mid right coronary artery occlusion treated with 2 overlapping drug-eluting stents with mild disease elsewhere 2.  Preserved left ventricular ejection fraction with LVEDP of 18 mmHg Recommendation: Dual antiplatelet therapy for 6 months and preferably 1 year and optimal medical therapy.   ? ?Cardiac Studies  ? ?Echo pending ?Assessment & Plan  ?  ?IMI-acute DES x2 ? ?RV infarction ? ?Complete heart block-transient ? ?Cigarette  abuse ? ?Sleep disordered breathing ? ?Sinus slowing and junctional bradycardia ? ?VT nonsustained ? ?Post stenting.  Blood pressure stable.  We will hold off on beta-blockers until possibly tomorrow given RV infarct and issues of blood pressure last night. ? ?Nonsustained VT you should be helped by beta-blockers.  It seems that his bradycardia and junctional rhythm is associated with snoring.  We will need an outpatient sleep study but would not hold beta-blockers because of this ? ?Discussed the importance of stopping smoking.  Would anticipate discharging on a patch ? ? ?For questions or updates, please contact [facility] ?Please consult [email or web address] for contact info under Cardiology/STEMI. ?  ?   ?Signed, ?Sherryl Manges, MD  ?08/15/2021, 9:43 AM   ? ?

## 2021-08-16 ENCOUNTER — Other Ambulatory Visit: Payer: Self-pay

## 2021-08-16 ENCOUNTER — Encounter (HOSPITAL_COMMUNITY): Payer: Self-pay | Admitting: Internal Medicine

## 2021-08-16 ENCOUNTER — Other Ambulatory Visit (HOSPITAL_COMMUNITY): Payer: Self-pay

## 2021-08-16 LAB — TROPONIN I (HIGH SENSITIVITY)
Troponin I (High Sensitivity): 17123 ng/L (ref <18)
Troponin I (High Sensitivity): 14718 ng/L (ref <18)

## 2021-08-16 MED ORDER — METOPROLOL TARTRATE 12.5 MG HALF TABLET
12.5000 mg | ORAL_TABLET | Freq: Two times a day (BID) | ORAL | Status: DC
Start: 2021-08-16 — End: 2021-08-17
  Administered 2021-08-16 (×2): 12.5 mg via ORAL
  Filled 2021-08-16 (×2): qty 1

## 2021-08-16 NOTE — TOC Benefit Eligibility Note (Signed)
Patient Advocate Encounter ? ?Insurance verification completed.   ? ?The patient is currently admitted and upon discharge could be taking Brilitna 90 mg. ? ?The current 30 day co-pay is, $45.00.  ? ?The patient is currently admitted and upon discharge could be taking Entresto 24-26 mg. ? ?The current 30 day co-pay is, $45.00.  ? ?The patient is currently admitted and upon discharge could be taking Farxiga 10 mg. ? ?Requires Prior Authorization ? ?The patient is currently admitted and upon discharge could be taking Jardiance 10 mg. ? ?Requires Prior Authorization ? ?The patient is insured through [employer]  ? ? ? ?Roland Earl, CPhT ?Pharmacy Patient Advocate Specialist ?[facility] Pharmacy Patient Advocate Team ?Direct Number: [phone]  Fax: [phone] ? ? ? ? ? ?  ?

## 2021-08-16 NOTE — Progress Notes (Signed)
At 1328 notified Dr. Lynnette Caffey about troponin critical level of 17,123 via secure chat. MD responded "ok, thanks." ? ?Another troponin level was drawn while I was in the room with the patient. Pt denied pain or discomfort and was able to walk independently. Right groin dressing was intact and dry and level 0. TR Band pressure dressing was intact and dry at level 0. ? ?The troponin that was reported at 1318 resulted at 14,718 and no critical lab was reported to me, the primary nurse. Lab value was noted and seen at end of shift with oncoming nurse as I was giving report. Notified Dr. Lynnette Caffey at [phone] with this result via secure chat. ?

## 2021-08-16 NOTE — TOC Progression Note (Addendum)
Transition of Care (TOC) - Progression Note  ? ? ?Patient Details  ?Name: Luis Foster ?MRN: [id number] ?Date of Birth: 12/23/1965 ? ?Transition of Care (TOC) CM/SW Contact  ?Zenon Mayo, RN ?Phone Number: ?08/16/2021, 1:28 PM ? ?Clinical Narrative:    ?From home with wife,,  STEMI, s/p stent, indep NCM made copy of insurance card will take to admissions.  Also benefit check done on medications.  NCM informed patient of copay for brilinta.  Will give patient the brilinata 5.00 copay card.  TOC will fill the first 30 days free for patient.  Wife will transport home at dc. TOC will continue to follow for dc needs. PCP is Alexiss Hardin Negus, will make follow up apt on AVS.  ? ? ?  ?  ? ?Expected Discharge Plan and Services ?  ?  ?  ?  ?  ?                ?  ?  ?  ?  ?  ?  ?  ?  ?  ?  ? ? ?Social Determinants of Health (SDOH) Interventions ?  ? ?Readmission Risk Interventions ?   ? View : No data to display.  ?  ?  ?  ? ? ?

## 2021-08-16 NOTE — Plan of Care (Signed)
?  Problem: Education: ?Goal: Understanding of cardiac disease, CV risk reduction, and recovery process will improve ?Outcome: Progressing ?Goal: Understanding of medication regimen will improve ?Outcome: Progressing ?Goal: Individualized Educational Video(s) ?Outcome: Progressing ?  ?Problem: Activity: ?Goal: Ability to tolerate increased activity will improve ?Outcome: Progressing ?  ?Problem: Cardiac: ?Goal: Ability to achieve and maintain adequate cardiopulmonary perfusion will improve ?Outcome: Progressing ?Goal: Vascular access site(s) Level 0-1 will be maintained ?Outcome: Progressing ?  ?Problem: Health Behavior/Discharge Planning: ?Goal: Ability to safely manage health-related needs after discharge will improve ?Outcome: Progressing ?  ?Problem: Education: ?Goal: Knowledge of General Education information will improve ?Description: Including pain rating scale, medication(s)/side effects and non-pharmacologic comfort measures ?Outcome: Progressing ?  ?Problem: Health Behavior/Discharge Planning: ?Goal: Ability to manage health-related needs will improve ?Outcome: Progressing ?  ?Problem: Clinical Measurements: ?Goal: Ability to maintain clinical measurements within normal limits will improve ?Outcome: Progressing ?Goal: Will remain free from infection ?Outcome: Progressing ?Goal: Diagnostic test results will improve ?Outcome: Progressing ?Goal: Respiratory complications will improve ?Outcome: Progressing ?Goal: Cardiovascular complication will be avoided ?Outcome: Progressing ?  ?

## 2021-08-16 NOTE — Progress Notes (Signed)
CARDIAC REHAB PHASE I  ? ?PRE:  Rate/Rhythm: 67 SR ? ?  BP: sitting 127/83 ? ?  SaO2: 97 RA ? ?MODE:  Ambulation: 370 ft  ? ?POST:  Rate/Rhythm: 80 SR ? ?  BP: sitting 120/108, recheck 129/88  ? ?  SaO2: 96 RA ? ?Pt ambulated without c/o except stiff/sore calves. To recliner. Hard to get comfortable in recliner. Pt continues to c/o soreness from not moving and is very eager to d/c. He is very active in daily life.  ? ?Discussed with pt and wife MI, stents, Brilinta importance, restrictions, diet, smoking cessation, exercise, NTG and CRPII. Pt receptive. Plans to quit smoking. Will refer to [facility].  ?[id number] ? ?Ethelda Chick CES, ACSM ?08/16/2021 ?12:13 PM ? ? ? ? ?

## 2021-08-16 NOTE — Progress Notes (Signed)
? ?Progress Note ? ?Patient Name: Luis Foster ?Date of Encounter: 08/16/2021 ? ?[facility] Cardiologist: None  ? ?Subjective  ? ?Presented with acute inferior infarct and 1 hour of chest pain.  Had high-grade AV block and required transvenous femoral pacing.  Also treated with stent mid and distal RCA. ? ?Feels better today.  Denies chest discomfort.  Wants to go home today.  Explained to the patient that he is less than 48 hours out from a significant inferior infarct. ? ?We will probably transfer to telemetry and aim for discharge tomorrow. ? ?Inpatient Medications  ?  ?Scheduled Meds: ? aspirin  81 mg Oral Daily  ? atorvastatin  80 mg Oral Daily  ? Chlorhexidine Gluconate Cloth  6 each Topical Daily  ? enoxaparin (LOVENOX) injection  40 mg Subcutaneous Daily  ? mouth rinse  15 mL Mouth Rinse BID  ? nicotine  21 mg Transdermal Daily  ? pneumococcal 20-valent conjugate vaccine  0.5 mL Intramuscular Tomorrow-1000  ? sodium chloride flush  3 mL Intravenous Q12H  ? ticagrelor  90 mg Oral BID  ? ?Continuous Infusions: ? sodium chloride    ? norepinephrine (LEVOPHED) Adult infusion Stopped (08/15/21 0226)  ? ?PRN Meds: ?sodium chloride, acetaminophen, nitroGLYCERIN, ondansetron (ZOFRAN) IV, sodium chloride flush  ? ?Vital Signs  ?  ?Vitals:  ? 08/16/21 0300 08/16/21 0400 08/16/21 0715 08/16/21 0800  ?BP:  124/61  118/75  ?Pulse:  72  67  ?Resp:  (!) 21  (!) 30  ?Temp:  (!) 97.4 ?F (36.3 ?C) 98 ?F (36.7 ?C)   ?TempSrc:  Oral Oral   ?SpO2:  97%  96%  ?Weight: 104.3 kg     ? ? ?Intake/Output Summary (Last 24 hours) at 08/16/2021 1011 ?Last data filed at 08/16/2021 [id number] ?Gross per 24 hour  ?Intake 1000 ml  ?Output 1900 ml  ?Net -900 ml  ? ? ?  08/16/2021  ?  3:00 AM  ?Last 3 Weights  ?Weight (lbs) 230 lb  ?Weight (kg) 104.327 kg  ?   ? ?Telemetry  ?  ?Normal sinus rhythm without ventricular ectopy.- Personally Reviewed ? ?ECG  ?  ?Performed at 657 this a.m. reveals small inferior Q waves with T wave inversion in 3, 2,  and aVF.- Personally Reviewed ? ?Physical Exam  ?Healthy appearing ?GEN: No acute distress.   ?Neck: No JVD ?Cardiac: RRR, no murmurs, rubs, or gallops.  ?Respiratory: Clear to auscultation bilaterally. ?GI: Soft, nontender, non-distended  ?MS: No edema; No deformity. ?Neuro:  Nonfocal  ?Psych: Normal affect  ? ?Labs  ?  ?High Sensitivity Troponin:   ?Recent Labs  ?Lab 08/15/21 ?[id number]  ?TROPONINIHS 366*  ?   ?Chemistry ?Recent Labs  ?Lab 08/14/21 ?2330 08/15/21 ?0059 08/15/21 ?0148  ?NA 141 139 139  ?K 4.0 5.4* 4.1  ?CL 111 109 111  ?CO2  --  17* 21*  ?GLUCOSE 181* 165* 149*  ?BUN 22* 21* 21*  ?CREATININE 1.40* 1.45* 1.40*  ?CALCIUM  --  7.9* 8.3*  ?MG  --  1.8  --   ?PROT  --  6.0*  --   ?ALBUMIN  --  3.7  --   ?AST  --  24  --   ?ALT  --  21  --   ?ALKPHOS  --  79  --   ?BILITOT  --  1.0  --   ?GFRNONAA  --  56* 59*  ?ANIONGAP  --  13 7  ?  ?Lipids  ?Recent Labs  ?  Lab 08/15/21 ?0148  ?CHOL 190  ?TRIG 48  ?HDL 40*  ?LDLCALC 140*  ?CHOLHDL 4.8  ?  ?Hematology ?Recent Labs  ?Lab 08/14/21 ?2330 08/15/21 ?0059 08/15/21 ?0148  ?WBC  --  18.6* 16.8*  ?RBC  --  3.96* 4.69  ?HGB 12.2* 13.5 14.5  ?HCT 36.0* 37.5* 42.7  ?MCV  --  94.7 91.0  ?MCH  --  34.1* 30.9  ?MCHC  --  36.0 34.0  ?RDW  --  12.3 13.2  ?PLT  --  220 235  ? ?Thyroid  ?Recent Labs  ?Lab 08/15/21 ?0148  ?TSH 1.944  ?FREET4 1.05  ?  ?BNP ?Recent Labs  ?Lab 08/15/21 ?[id number]  ?BNP 14.3  ?  ?DDimer No results for input(s): DDIMER in the last 168 hours.  ? ?Radiology  ?  ?CARDIAC CATHETERIZATION ? ?Addendum Date: 08/15/2021   ?  Mid LM lesion is 10% stenosed.   1st Diag lesion is 30% stenosed.   Ost LAD lesion is 20% stenosed.   Mid RCA lesion is 100% stenosed.   A stent was successfully placed.   Post intervention, there is a 0% residual stenosis.   LV end diastolic pressure is normal.   The left ventricular ejection fraction is 50-55% by visual estimate. 1.  100% mid right coronary artery occlusion treated with 2 overlapping drug-eluting stents with mild disease  elsewhere 2.  Preserved left ventricular ejection fraction with LVEDP of 18 mmHg Recommendation: Dual antiplatelet therapy for 1 year and optimal medical therapy. ? ?Result Date: 08/15/2021 ?  Mid LM lesion is 10% stenosed.   1st Diag lesion is 30% stenosed.   Ost LAD lesion is 20% stenosed.   Mid RCA lesion is 100% stenosed.   A stent was successfully placed.   Post intervention, there is a 0% residual stenosis.   LV end diastolic pressure is normal.   The left ventricular ejection fraction is 50-55% by visual estimate. 1.  100% mid right coronary artery occlusion treated with 2 overlapping drug-eluting stents with mild disease elsewhere 2.  Preserved left ventricular ejection fraction with LVEDP of 18 mmHg Recommendation: Dual antiplatelet therapy for 6 months and preferably 1 year and optimal medical therapy.  ? ?DG CHEST PORT 1 VIEW ? ?Result Date: 08/15/2021 ?CLINICAL DATA:  STEMI EXAM: PORTABLE CHEST 1 VIEW COMPARISON:  None. FINDINGS: The heart size and mediastinal contours are within normal limits. Subtle interstitial opacities in the perihilar and bibasilar regions, left worse than right. No pleural effusion or pneumothorax. The visualized skeletal structures are unremarkable. IMPRESSION: Subtle interstitial opacities in the perihilar and bibasilar regions, left worse than right. Findings could reflect mild edema versus atypical/viral infection. Electronically Signed   By: Davina Poke D.O.   On: 08/15/2021 12:53  ? ?ECHOCARDIOGRAM COMPLETE ? ?Result Date: 08/15/2021 ?   ECHOCARDIOGRAM REPORT   Patient Name:   Luis Foster Date of Exam: 08/15/2021 Medical Rec #:  [id number]       Height: Accession #:    [id number]      Weight: Date of Birth:  [birth year]       BSA: Patient Age:    56 years        BP:           114/69 mmHg Patient Gender: M               HR:           73 bpm. Exam Location:  Inpatient Procedure: 2D Echo, Cardiac Doppler, Color  Doppler and Intracardiac            Opacification Agent  Indications:    Acute myocardial infarction, unspecified I21.9  History:        Patient has prior history of Echocardiogram examinations, most                 recent 03/02/2021.  Sonographer:    Bernadene Person RDCS Referring Phys: [id number] [place]  Sonographer Comments: Image acquisition challenging due to respiratory motion. IMPRESSIONS  1. Left ventricular ejection fraction, by estimation, is 40 to 45%. The left ventricle has mildly decreased function. The left ventricle demonstrates regional wall motion abnormalities (see scoring diagram/findings for description). There is mild left ventricular hypertrophy. Left ventricular diastolic parameters were normal.  2. Right ventricular systolic function is normal. The right ventricular size is normal. Tricuspid regurgitation signal is inadequate for assessing PA pressure.  3. The mitral valve is normal in structure. Trivial mitral valve regurgitation. No evidence of mitral stenosis.  4. The aortic valve is tricuspid. Aortic valve regurgitation is not visualized. No aortic stenosis is present.  5. The inferior vena cava is normal in size with greater than 50% respiratory variability, suggesting right atrial pressure of 3 mmHg. FINDINGS  Left Ventricle: Left ventricular ejection fraction, by estimation, is 40 to 45%. The left ventricle has mildly decreased function. The left ventricle demonstrates regional wall motion abnormalities. Definity contrast agent was given IV to delineate the left ventricular endocardial borders. The left ventricular internal cavity size was normal in size. There is mild left ventricular hypertrophy. Left ventricular diastolic parameters were normal.  LV Wall Scoring: The entire inferior wall, mid inferoseptal segment, and basal inferoseptal segment are hypokinetic. The entire anterior wall, entire lateral wall, entire anterior septum, and apex are normal. Right Ventricle: The right ventricular size is normal. No increase in right  ventricular wall thickness. Right ventricular systolic function is normal. Tricuspid regurgitation signal is inadequate for assessing PA pressure. Left Atrium: Left atrial size was normal in size. Right Atrium: Right

## 2021-08-16 NOTE — Progress Notes (Signed)
Pt is alert and oriented x 4, no complaints of pain at end of shift. ?

## 2021-08-17 ENCOUNTER — Other Ambulatory Visit (HOSPITAL_COMMUNITY): Payer: Self-pay

## 2021-08-17 DIAGNOSIS — E785 Hyperlipidemia, unspecified: Secondary | ICD-10-CM

## 2021-08-17 DIAGNOSIS — I10 Essential (primary) hypertension: Secondary | ICD-10-CM

## 2021-08-17 DIAGNOSIS — Z720 Tobacco use: Secondary | ICD-10-CM

## 2021-08-17 LAB — BASIC METABOLIC PANEL
Anion gap: 8 (ref 5–15)
BUN: 17 mg/dL (ref 6–20)
CO2: 23 mmol/L (ref 22–32)
Calcium: 8.8 mg/dL — ABNORMAL LOW (ref 8.9–10.3)
Chloride: 106 mmol/L (ref 98–111)
Creatinine, Ser: 1.1 mg/dL (ref 0.61–1.24)
GFR, Estimated: >60 mL/min (ref >60)
Glucose, Bld: 109 mg/dL — ABNORMAL HIGH (ref 70–99)
Potassium: 3.8 mmol/L (ref 3.5–5.1)
Sodium: 137 mmol/L (ref 135–145)

## 2021-08-17 MED ORDER — NITROGLYCERIN 0.4 MG SL SUBL
0.4000 mg | SUBLINGUAL_TABLET | SUBLINGUAL | 2 refills | Status: AC | PRN
  Filled 2021-08-17: qty 25, 14d supply, fill #0

## 2021-08-17 MED ORDER — METOPROLOL SUCCINATE ER 25 MG PO TB24
12.5000 mg | ORAL_TABLET | Freq: Every day | ORAL | Status: DC
  Administered 2021-08-17: 12.5 mg via ORAL
  Filled 2021-08-17: qty 1

## 2021-08-17 MED ORDER — ATORVASTATIN CALCIUM 80 MG PO TABS
80.0000 mg | ORAL_TABLET | Freq: Every day | ORAL | 1 refills | Status: AC
Start: 2021-08-17 — End: ?
  Filled 2021-08-17: qty 30, 30d supply, fill #0

## 2021-08-17 MED ORDER — LOSARTAN POTASSIUM 25 MG PO TABS
12.5000 mg | ORAL_TABLET | Freq: Every day | ORAL | Status: DC
  Administered 2021-08-17: 12.5 mg via ORAL
  Filled 2021-08-17: qty 1

## 2021-08-17 MED ORDER — ASPIRIN 81 MG PO CHEW
81.0000 mg | CHEWABLE_TABLET | Freq: Every day | ORAL | 1 refills | Status: DC
  Filled 2021-08-17: qty 30, 30d supply, fill #0

## 2021-08-17 MED ORDER — METOPROLOL SUCCINATE ER 25 MG PO TB24
12.5000 mg | ORAL_TABLET | Freq: Every day | ORAL | 1 refills | Status: AC
Start: 2021-08-17 — End: ?
  Filled 2021-08-17: qty 15, 30d supply, fill #0

## 2021-08-17 MED ORDER — TICAGRELOR 90 MG PO TABS
90.0000 mg | ORAL_TABLET | Freq: Two times a day (BID) | ORAL | 2 refills | Status: DC
  Filled 2021-08-17: qty 60, 30d supply, fill #0

## 2021-08-17 MED ORDER — LOSARTAN POTASSIUM 25 MG PO TABS
12.5000 mg | ORAL_TABLET | Freq: Every day | ORAL | 1 refills | Status: AC
  Filled 2021-08-17: qty 15, 30d supply, fill #0

## 2021-08-17 NOTE — Plan of Care (Signed)
?Problem: Education: ?Goal: Understanding of cardiac disease, CV risk reduction, and recovery process will improve ?08/17/2021 1048 by Serafina Mitchell, RN ?Outcome: Completed/Met ?08/17/2021 1026 by Serafina Mitchell, RN ?Outcome: Progressing ?Goal: Understanding of medication regimen will improve ?08/17/2021 1048 by Serafina Mitchell, RN ?Outcome: Completed/Met ?08/17/2021 1026 by Serafina Mitchell, RN ?Outcome: Progressing ?Goal: Individualized Educational Video(s) ?08/17/2021 1048 by Serafina Mitchell, RN ?Outcome: Completed/Met ?08/17/2021 1026 by Serafina Mitchell, RN ?Outcome: Progressing ?  ?Problem: Activity: ?Goal: Ability to tolerate increased activity will improve ?08/17/2021 1048 by Serafina Mitchell, RN ?Outcome: Completed/Met ?08/17/2021 1026 by Serafina Mitchell, RN ?Outcome: Progressing ?  ?Problem: Cardiac: ?Goal: Ability to achieve and maintain adequate cardiopulmonary perfusion will improve ?08/17/2021 1048 by Serafina Mitchell, RN ?Outcome: Completed/Met ?08/17/2021 1026 by Serafina Mitchell, RN ?Outcome: Progressing ?Goal: Vascular access site(s) Level 0-1 will be maintained ?08/17/2021 1048 by Serafina Mitchell, RN ?Outcome: Completed/Met ?08/17/2021 1026 by Serafina Mitchell, RN ?Outcome: Progressing ?  ?Problem: Health Behavior/Discharge Planning: ?Goal: Ability to safely manage health-related needs after discharge will improve ?08/17/2021 1048 by Serafina Mitchell, RN ?Outcome: Completed/Met ?08/17/2021 1026 by Serafina Mitchell, RN ?Outcome: Progressing ?  ?Problem: Education: ?Goal: Knowledge of General Education information will improve ?Description: Including pain rating scale, medication(s)/side effects and non-pharmacologic comfort measures ?08/17/2021 1048 by Serafina Mitchell, RN ?Outcome: Completed/Met ?08/17/2021 1026 by Serafina Mitchell, RN ?Outcome: Progressing ?  ?Problem: Health Behavior/Discharge Planning: ?Goal: Ability to manage health-related needs will  improve ?08/17/2021 1048 by Serafina Mitchell, RN ?Outcome: Completed/Met ?08/17/2021 1026 by Serafina Mitchell, RN ?Outcome: Progressing ?  ?Problem: Clinical Measurements: ?Goal: Ability to maintain clinical measurements within normal limits will improve ?08/17/2021 1048 by Serafina Mitchell, RN ?Outcome: Completed/Met ?08/17/2021 1026 by Serafina Mitchell, RN ?Outcome: Progressing ?Goal: Will remain free from infection ?08/17/2021 1048 by Serafina Mitchell, RN ?Outcome: Completed/Met ?08/17/2021 1026 by Serafina Mitchell, RN ?Outcome: Progressing ?Goal: Diagnostic test results will improve ?08/17/2021 1048 by Serafina Mitchell, RN ?Outcome: Completed/Met ?08/17/2021 1026 by Serafina Mitchell, RN ?Outcome: Progressing ?Goal: Respiratory complications will improve ?08/17/2021 1048 by Serafina Mitchell, RN ?Outcome: Completed/Met ?08/17/2021 1026 by Serafina Mitchell, RN ?Outcome: Progressing ?Goal: Cardiovascular complication will be avoided ?08/17/2021 1048 by Serafina Mitchell, RN ?Outcome: Completed/Met ?08/17/2021 1026 by Serafina Mitchell, RN ?Outcome: Progressing ?  ?Problem: Activity: ?Goal: Risk for activity intolerance will decrease ?08/17/2021 1048 by Serafina Mitchell, RN ?Outcome: Completed/Met ?08/17/2021 1026 by Serafina Mitchell, RN ?Outcome: Progressing ?  ?Problem: Nutrition: ?Goal: Adequate nutrition will be maintained ?08/17/2021 1048 by Serafina Mitchell, RN ?Outcome: Completed/Met ?08/17/2021 1026 by Serafina Mitchell, RN ?Outcome: Progressing ?  ?Problem: Coping: ?Goal: Level of anxiety will decrease ?08/17/2021 1048 by Serafina Mitchell, RN ?Outcome: Completed/Met ?08/17/2021 1026 by Serafina Mitchell, RN ?Outcome: Progressing ?  ?Problem: Elimination: ?Goal: Will not experience complications related to bowel motility ?08/17/2021 1048 by Serafina Mitchell, RN ?Outcome: Completed/Met ?08/17/2021 1026 by Serafina Mitchell, RN ?Outcome: Progressing ?Goal: Will not experience  complications related to urinary retention ?08/17/2021 1048 by Serafina Mitchell, RN ?Outcome: Completed/Met ?08/17/2021 1026 by Serafina Mitchell, RN ?Outcome: Progressing ?  ?Problem: Pain Managment: ?Goal: General experience of comfort will improve ?08/17/2021 1048 by Serafina Mitchell, RN ?Outcome: Completed/Met ?08/17/2021 1026 by Serafina Mitchell, RN ?Outcome: Progressing ?  ?Problem: Safety: ?Goal: Ability to remain free from injury will improve ?08/17/2021 1048 by Lora Paula  D, RN ?Outcome: Completed/Met ?08/17/2021 1026 by Serafina Mitchell, RN ?Outcome: Progressing ?  ?Problem: Skin Integrity: ?Goal: Risk for impaired skin integrity will decrease ?08/17/2021 1048 by Serafina Mitchell, RN ?Outcome: Completed/Met ?08/17/2021 1026 by Serafina Mitchell, RN ?Outcome: Progressing ?  ?

## 2021-08-17 NOTE — TOC Transition Note (Signed)
Transition of Care (TOC) - CM/SW Discharge Note ? ? ?Patient Details  ?Name: Luis Foster ?MRN: [id number] ?Date of Birth: April 19, 1966 ? ?Transition of Care (TOC) CM/SW Contact:  ?Leone Haven, RN ?Phone Number: ?08/17/2021, 9:59 AM ? ? ?Clinical Narrative:    ?Patient is for dc today, wife will transport him home. He has the brilinta coupons, TOC to fill medications. Follow up with PCP on AVS.  ? ? ?  ?  ? ? ?Patient Goals and CMS Choice ?  ?  ?  ? ?Discharge Placement ?  ?           ?  ?  ?  ?  ? ?Discharge Plan and Services ?  ?  ?           ?  ?  ?  ?  ?  ?  ?  ?  ?  ?  ? ?Social Determinants of Health (SDOH) Interventions ?  ? ? ?Readmission Risk Interventions ?   ? View : No data to display.  ?  ?  ?  ? ? ? ? ? ?

## 2021-08-17 NOTE — Progress Notes (Signed)
CARDIAC REHAB PHASE I  ? ?PRE:  Rate/Rhythm: 69 SR ? ?  BP: sitting 111/94 ? ?  SaO2:  ? ?MODE:  Ambulation: 1100 ft  ? ?POST:  Rate/Rhythm: 85 SR ? ?  BP: sitting 125/81  ? ?  SaO2:  ? ?Tolerated well. Eager to d/c. Reviewed ed, asking about his heart function. ?[phone]  ? ?Ethelda Chick CES, ACSM ?08/17/2021 ?8:55 AM ? ? ? ? ?

## 2021-08-17 NOTE — Progress Notes (Signed)
Pt sitting on the side of the bed due to the bed not being comfortable.  Requested ginger ale and more pillows that was given to him ? ?

## 2021-08-17 NOTE — Progress Notes (Signed)
? ?Progress Note ? ?Patient Name: Luis Foster ?Date of Encounter: 08/17/2021 ? ?[facility] Cardiologist: None  ? ?Subjective  ? ?Feels better today.  Frustrated he was unable to go home yesterday. ? ?Inpatient Medications  ?  ?Scheduled Meds: ? aspirin  81 mg Oral Daily  ? atorvastatin  80 mg Oral Daily  ? Chlorhexidine Gluconate Cloth  6 each Topical Daily  ? enoxaparin (LOVENOX) injection  40 mg Subcutaneous Daily  ? mouth rinse  15 mL Mouth Rinse BID  ? metoprolol tartrate  12.5 mg Oral BID  ? nicotine  21 mg Transdermal Daily  ? pneumococcal 20-valent conjugate vaccine  0.5 mL Intramuscular Tomorrow-1000  ? sodium chloride flush  3 mL Intravenous Q12H  ? ticagrelor  90 mg Oral BID  ? ?Continuous Infusions: ? sodium chloride    ? norepinephrine (LEVOPHED) Adult infusion Stopped (08/15/21 0226)  ? ?PRN Meds: ?sodium chloride, acetaminophen, nitroGLYCERIN, ondansetron (ZOFRAN) IV, sodium chloride flush  ? ?Vital Signs  ?  ?Vitals:  ? 08/16/21 1243 08/16/21 1730 08/17/21 0444 08/17/21 0816  ?BP: 125/74 136/73 113/69 (!) 111/94  ?Pulse: 75 68 66 82  ?Resp: 17 19 18 18   ?Temp: 97.7 ?F (36.5 ?C)  98.2 ?F (36.8 ?C)   ?TempSrc: Oral  Oral   ?SpO2: 99% 96% 98% 97%  ?Weight: 104.6 kg  103.1 kg   ?Height: 6\' 2"  (1.88 m)     ? ? ?Intake/Output Summary (Last 24 hours) at 08/17/2021 0854 ?Last data filed at 08/17/2021 0844 ?Gross per 24 hour  ?Intake 1007.91 ml  ?Output 1125 ml  ?Net -117.09 ml  ? ? ?  08/17/2021  ?  4:44 AM 08/16/2021  ? 12:43 PM 08/16/2021  ?  3:00 AM  ?Last 3 Weights  ?Weight (lbs) 227 lb 3.2 oz 230 lb 9.6 oz 230 lb  ?Weight (kg) 103.057 kg 104.6 kg 104.327 kg  ?   ? ?Telemetry  ?  ?Normal sinus rhythm without ventricular ectopy.- Personally Reviewed ? ?ECG  ?  ?Performed at 657 this a.m. reveals small inferior Q waves with T wave inversion in 3, 2, and aVF.- Personally Reviewed ? ?Physical Exam  ?Healthy appearing ?GEN: No acute distress.   ?Neck: No JVD ?Cardiac: RRR, no murmurs, rubs, or gallops.   ?Respiratory: Clear to auscultation bilaterally. ?GI: Soft, nontender, non-distended  ?MS: No edema; No deformity. ?Neuro:  Nonfocal  ?Psych: Normal affect  ? ?Labs  ?  ?High Sensitivity Troponin:   ?Recent Labs  ?Lab 08/15/21 ?0059 08/16/21 ?1128 08/16/21 ?1318  ?TROPONINIHS 366* 17,123* 14,718*  ?   ?Chemistry ?Recent Labs  ?Lab 08/15/21 ?0059 08/15/21 ?0148 08/17/21 ?0459  ?NA 139 139 137  ?K 5.4* 4.1 3.8  ?CL 109 111 106  ?CO2 17* 21* 23  ?GLUCOSE 165* 149* 109*  ?BUN 21* 21* 17  ?CREATININE 1.45* 1.40* 1.10  ?CALCIUM 7.9* 8.3* 8.8*  ?MG 1.8  --   --   ?PROT 6.0*  --   --   ?ALBUMIN 3.7  --   --   ?AST 24  --   --   ?ALT 21  --   --   ?ALKPHOS 79  --   --   ?BILITOT 1.0  --   --   ?GFRNONAA 56* 59* >60  ?ANIONGAP 13 7 8   ?  ?Lipids  ?Recent Labs  ?Lab 08/15/21 ?0148  ?CHOL 190  ?TRIG 48  ?HDL 40*  ?LDLCALC 140*  ?CHOLHDL 4.8  ?  ?Hematology ?Recent Labs  ?  Lab 08/14/21 ?2330 08/15/21 ?0059 08/15/21 ?0148  ?WBC  --  18.6* 16.8*  ?RBC  --  3.96* 4.69  ?HGB 12.2* 13.5 14.5  ?HCT 36.0* 37.5* 42.7  ?MCV  --  94.7 91.0  ?MCH  --  34.1* 30.9  ?MCHC  --  36.0 34.0  ?RDW  --  12.3 13.2  ?PLT  --  220 235  ? ?Thyroid  ?Recent Labs  ?Lab 08/15/21 ?0148  ?TSH 1.944  ?FREET4 1.05  ?  ?BNP ?Recent Labs  ?Lab 08/15/21 ?0059  ?BNP 14.3  ?  ?DDimer No results for input(s): DDIMER in the last 168 hours.  ? ?Radiology  ?  ?No results found. ? ?Cardiac Studies  ? ?2D Doppler echocardiogram 08/15/2021: ?IMPRESSIONS  ? ? ? 1. Left ventricular ejection fraction, by estimation, is 40 to 45%. The  ?left ventricle has mildly decreased function. The left ventricle  ?demonstrates regional wall motion abnormalities (see scoring  ?diagram/findings for description). There is mild left  ?ventricular hypertrophy. Left ventricular diastolic parameters were  ?normal.  ? 2. Right ventricular systolic function is normal. The right ventricular  ?size is normal. Tricuspid regurgitation signal is inadequate for assessing  ?PA pressure.  ? 3. The mitral  valve is normal in structure. Trivial mitral valve  ?regurgitation. No evidence of mitral stenosis.  ? 4. The aortic valve is tricuspid. Aortic valve regurgitation is not  ?visualized. No aortic stenosis is present.  ? 5. The inferior vena cava is normal in size with greater than 50%  ?respiratory variability, suggesting right atrial pressure of 3 mmHg.  ? ? ?Cardiac cath with PCI 08/14/2021: ?Intervention ? ? ? ?Patient Profile  ?   ?56 y.o. male previously healthy smoker who developed acute inferior myocardial infarction with associated high-grade AV block after racing go carts on Saturday evening.  Presented within 1 hour of chest pain onset ? ?Assessment & Plan  ?  ?Acute inferior ST elevation MI: Current regimen includes ticagrelor, aspirin, and high intensity statin therapy. ?Acute combined systolic and diastolic heart failure: Change metoprolol to tartrate to succinate.  Add low-dose ARB, losartan 12.5 mg/day.  If dyspnea as OP, consider adding low-dose spironolactone 12.5 mg/day. ?Cardiogenic shock: Resolved ?Hyperlipidemia: High intensity statin therapy has been added ?Tobacco abuse: Smoking cessation recommended. ? ? ?Has done well with cardiac rehab today.  Plan discharge this morning. ? ?For questions or updates, please contact [facility] ?Please consult [email or web address] for contact info under  ? ?  ?   ?Signed, ?Lesleigh Noe, MD  ?08/17/2021, 8:54 AM   ? ? ? ?

## 2021-08-17 NOTE — Plan of Care (Signed)

## 2021-08-17 NOTE — Progress Notes (Signed)
Pt left unit for discharge lounge with another staff member. He is alert, not wearing a mask, and IV was removed. Medication education was completed. Pt verbalizes understanding all teaching. ?

## 2021-08-17 NOTE — Discharge Summary (Addendum)
? ?The patient has been seen in conjunction with Luis Bellis, NP. All aspects of care have been considered and discussed. The patient has been personally interviewed, examined, and all clinical data has been reviewed. ? ?Doing well. ?Low dose ARB and Beta blocker need further titration as BP allows. ?Counseled to stop tobacco. ?Aggressive RFM started. ? ? ? ?Discharge Summary  ?  ?Patient ID: Luis Foster ?MRM: [id number]; DOB: 1965-08-31 ? ?Admit date: 08/14/2021 ?Discharge date: 08/17/2021 ? ?PCP:  Pcp, No ?  ?[facility] Providers ?Cardiologist:  Early Osmond, MD    ? ? ?Discharge Diagnoses  ?  ?Principal Problem: ?  STEMI (ST elevation myocardial infarction) ([place]) ?Active Problems: ?  Hypertension ?  Hyperlipidemia ?  Tobacco abuse ? ? ?Diagnostic Studies/Procedures  ?  ?Cath: 08/14/21 ? ?  Mid LM lesion is 10% stenosed. ?  1st Diag lesion is 30% stenosed. ?  Ost LAD lesion is 20% stenosed. ?  Mid RCA lesion is 100% stenosed. ?  A stent was successfully placed. ?  Post intervention, there is a 0% residual stenosis. ?  LV end diastolic pressure is normal. ?  The left ventricular ejection fraction is 50-55% by visual estimate. ?  ?1.  100% mid right coronary artery occlusion treated with 2 overlapping drug-eluting stents with mild disease elsewhere ?2.  Preserved left ventricular ejection fraction with LVEDP of 18 mmHg ?  ?Recommendation: Dual antiplatelet therapy for 1 year and optimal medical therapy. ? ?Diagnostic ?Dominance: Right ?Intervention ? ? ? ?Echo: 08/15/21 ? ?IMPRESSIONS  ? ? ? 1. Left ventricular ejection fraction, by estimation, is 40 to 45%. The  ?left ventricle has mildly decreased function. The left ventricle  ?demonstrates regional wall motion abnormalities (see scoring  ?diagram/findings for description). There is mild left  ?ventricular hypertrophy. Left ventricular diastolic parameters were  ?normal.  ? 2. Right ventricular systolic function is normal. The right ventricular  ?size  is normal. Tricuspid regurgitation signal is inadequate for assessing  ?PA pressure.  ? 3. The mitral valve is normal in structure. Trivial mitral valve  ?regurgitation. No evidence of mitral stenosis.  ? 4. The aortic valve is tricuspid. Aortic valve regurgitation is not  ?visualized. No aortic stenosis is present.  ? 5. The inferior vena cava is normal in size with greater than 50%  ?respiratory variability, suggesting right atrial pressure of 3 mmHg.  ? ?FINDINGS  ? Left Ventricle: Left ventricular ejection fraction, by estimation, is 40  ?to 45%. The left ventricle has mildly decreased function. The left  ?ventricle demonstrates regional wall motion abnormalities. Definity  ?contrast agent was given IV to delineate the  ?left ventricular endocardial borders. The left ventricular internal cavity  ?size was normal in size. There is mild left ventricular hypertrophy. Left  ?ventricular diastolic parameters were normal.  ? ?   ?LV Wall Scoring:  ?The entire inferior wall, mid inferoseptal segment, and basal inferoseptal  ?segment are hypokinetic. The entire anterior wall, entire lateral wall,  ?entire anterior septum, and apex are normal.  ? ?Right Ventricle: The right ventricular size is normal. No increase in  ?right ventricular wall thickness. Right ventricular systolic function is  ?normal. Tricuspid regurgitation signal is inadequate for assessing PA  ?pressure.  ? ?Left Atrium: Left atrial size was normal in size.  ? ?Right Atrium: Right atrial size was normal in size.  ? ?Pericardium: There is no evidence of pericardial effusion.  ? ?Mitral Valve: The mitral valve is normal in structure. Trivial mitral  ?  valve regurgitation. No evidence of mitral valve stenosis.  ? ?Tricuspid Valve: The tricuspid valve is normal in structure. Tricuspid  ?valve regurgitation is trivial.  ? ?Aortic Valve: The aortic valve is tricuspid. Aortic valve regurgitation is  ?not visualized. No aortic stenosis is present.  ? ?Pulmonic  Valve: The pulmonic valve was not well visualized. Pulmonic valve  ?regurgitation is not visualized.  ? ?Aorta: The aortic root is normal in size and structure.  ? ?Venous: The inferior vena cava is normal in size with greater than 50%  ?respiratory variability, suggesting right atrial pressure of 3 mmHg.  ? ?IAS/Shunts: The interatrial septum was not well visualized.  ?_____________ ?  ?History of Present Illness   ?  ?Luis Foster is a 56 y.o. male with active tobacco use, longstanding 1.5 ppd smoking years, hyperlipidemia with last LDL 170 not on statin therapy, who presented on 08/14/2021 for the evaluation of STEMI. He was at the go-cart track and developed chest pain. Had inferior ST elevations with reciprocal anterolateral ST depressions consistent with inferior STEMI. Bought to the cath lab for emergent cardiac cath.   ? ?Hospital Course  ?   ?STEMI: Underwent cardiac cath noted above with 100% mid right coronary artery occlusion treated with 2 overlapping drug-eluting stents with mild disease elsewhere. Placed on DAPT with ASA/Brilinta for at least one year. hsTn [place] [address]. No recurrent chest pain. Seen by CR. ?-- continue on ASA, Brilinta, statin, metoprolol and losartan ? ?Cardiogenic shock: developed high-grade AV block in the setting of acute MI and did require transvenous femoral pacing. Rates improved.  ?-- Was able to tolerate low dose BB ? 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  ?-- tolerated the addition of low dose Toprol 12.5mg  daily and losartan 12.5mg  daily ?-- no volume overload during admission ? ?HLD: LDL 140 ?-- started on atorvastatin 80mg  daily ?-- will need LFT/FLP in 8 weeks ? ?Tobacco use: cessation advised ? ?General: Well developed, well nourished, male appearing in no acute distress. ?Head: Normocephalic, atraumatic.  ?Neck: Supple without bruits, JVD. ?Lungs:  Resp regular and unlabored,  CTA. ?Heart: RRR, S1, S2, no S3, S4, or murmur; no rub. ?Abdomen: Soft, non-tender, non-distended with normoactive bowel sounds. No hepatomegaly. No rebound/guarding. No obvious abdominal masses. ?Extremities: No clubbing, cyanosis, edema. Distal pedal pulses are 2+ bilaterally. Right cath site stable without bruising or hematoma ?Neuro: Alert and oriented X 3. Moves all extremities spontaneously. ?Psych: Normal affect. ? ?Patient was seen by Dr. Tamala Julian and deemed stable to discharge home. Follow up in the office arranged. Medications sent to [facility] pharmacy ? ?Did the patient have an acute coronary syndrome (MI, NSTEMI, STEMI, etc) this admission?:  Yes                              ? ?AHA/ACC Clinical Performance & Quality Measures: ?Aspirin prescribed? - Yes ?ADP Receptor Inhibitor (Plavix/Clopidogrel, Brilinta/Ticagrelor or Effient/Prasugrel) prescribed (includes medically managed patients)? - Yes ?Beta Blocker prescribed? - Yes ?High Intensity Statin (Lipitor 40-80mg  or Crestor 20-40mg ) prescribed? - Yes ?EF assessed during THIS hospitalization? - Yes ?For EF <40%, was ACEI/ARB prescribed? - Yes ?For EF <40%, Aldosterone Antagonist (Spironolactone or Eplerenone) prescribed? - Not Applicable (EF >/= [id number]) ?Cardiac Rehab Phase II ordered (including medically managed patients)? - Yes  ? ?   ? ?The patient will be scheduled for a TOC follow up  appointment in 10-14 days.  A message has been sent to the [facility] and Scheduling Pool at the office where the patient should be seen for follow up.  ?_____________ ? ?Discharge Vitals ?Blood pressure (!) 111/94, pulse 82, temperature 98.2 ?F (36.8 ?C), temperature source Oral, resp. rate 18, height 6\' 2"  (1.88 m), weight 103.1 kg, SpO2 97 %.  ?Filed Weights  ? 08/16/21 0300 08/16/21 1243 08/17/21 0444  ?Weight: 104.3 kg 104.6 kg 103.1 kg  ? ? ?Labs & Radiologic Studies  ?  ?CBC ?Recent Labs  ?  08/15/21 ?0059 08/15/21 ?0148  ?WBC 18.6* 16.8*  ?NEUTROABS 13.6*  --   ?HGB 13.5 14.5   ?HCT 37.5* 42.7  ?MCV 94.7 91.0  ?PLT 220 235  ? ?Basic Metabolic Panel ?Recent Labs  ?  08/15/21 ?0059 08/15/21 ?0148 08/17/21 ?[id number]  ?NA 139 139 137  ?K 5.4* 4.1 3.8  ?CL 109 111 106  ?CO2 17* 21* 2

## 2021-08-18 ENCOUNTER — Other Ambulatory Visit (HOSPITAL_COMMUNITY): Payer: Self-pay

## 2021-08-20 ENCOUNTER — Telehealth (HOSPITAL_COMMUNITY): Payer: Self-pay

## 2021-08-20 NOTE — Telephone Encounter (Signed)
Per phase I cardiac rehab, fax cardiac rehab referral to [place] [facility]. 

## 2021-08-26 ENCOUNTER — Other Ambulatory Visit (HOSPITAL_COMMUNITY): Payer: Self-pay

## 2021-08-26 ENCOUNTER — Telehealth (HOSPITAL_COMMUNITY): Payer: Self-pay

## 2021-08-26 NOTE — Telephone Encounter (Signed)
Pharmacy Transitions of Care Follow-up Telephone Call ? ?Date of discharge: 08/17/21  ?Discharge Diagnosis: STEMI ? ?How have you been since you were released from the hospital? Patient states that's he's doing pretty good. Patient is new to taking medications, which is the biggest hurdle for him and is also experiencing some 'woosiness' about 15 min after taking his pills in the morning, but it goes away after a few minutes. Blood pressure has been stable. Patient offered mail order service, but is going to wait until after follow up and insurance determination to make a decision on where to get his refills filled.  ? ?Medication changes made at discharge: ?START taking these medications ? ?START taking these medications  ?Aspirin Low Dose 81 MG chewable tablet ?Generic drug: aspirin ?Chew 1 tablet (81 mg total) by mouth daily.  ?atorvastatin 80 MG tablet ?Commonly known as: LIPITOR ?Take 1 tablet (80 mg total) by mouth daily.  ?Brilinta 90 MG Tabs tablet ?Generic drug: ticagrelor ?Take 1 tablet (90 mg total) by mouth 2 (two) times daily.  ?losartan 25 MG tablet ?Commonly known as: COZAAR ?Take 1/2 tablet (12.5 mg total) by mouth daily.  ?metoprolol succinate 25 MG 24 hr tablet ?Commonly known as: TOPROL-XL ?Take 1/2 tablets (12.5 mg total) by mouth daily.  ?nitroGLYCERIN 0.4 MG SL tablet ?Commonly known as: NITROSTAT ?Place 1 tablet (0.4 mg total) under the tongue every 5 (five) minutes x 3 doses as needed for chest pain.  ? ?CONTINUE taking these medications ? ?CONTINUE taking these medications  ?acetaminophen 325 MG tablet ?Commonly known as: TYLENOL  ? ?STOP taking these medications ? ?STOP taking these medications  ?ibuprofen 800 MG tablet ?Commonly known as: ADVIL  ? ? ?Medication changes verified by the patient? yes  ?  ? ?Medication Accessibility: ? ?Home Pharmacy: [facility] [phone] [place]  ? ?Was the patient provided with refills on discharged medications? yes  ? ?Have all prescriptions been transferred  from [facility] to home pharmacy? Patient waiting to decide until he's had a follow up appointment to make sure nothing will change.  ? ?Is the patient able to afford medications? Commercial ins ?Notable copays: $5 brilinta ?Eligible patient assistance: no ?  ? ?Medication Review: ?TICAGRELOR (BRILINTA) ?Ticagrelor 90 mg BID initiated on 08/15/21.  ?- Educated patient on expected duration of therapy of aspirin 81mg  with ticagrelor. Advised patient that DAPT for 1 year. Aspirin will be continued indefinitely ?- Discussed importance of taking medication around the same time every day, ?- Reviewed potential DDIs with patient ?- Advised patient of medications to avoid (NSAIDs, aspirin maintenance doses>100 mg daily) ?- Educated that Tylenol (acetaminophen) will be the preferred analgesic to prevent risk of bleeding  ?- Emphasized importance of monitoring for signs and symptoms of bleeding (abnormal bruising, prolonged bleeding, nose bleeds, bleeding from gums, discolored urine, black tarry stools)  ?- Educated patient to notify doctor if shortness of breath or abnormal heartbeat occur ?- Advised patient to alert all providers of antiplatelet therapy prior to starting a new medication or having a procedure  ? ?Follow-up Appointments: ?Date Visit Type Length Department   ? 08/27/2021  1:30 PM OFFICE VISIT 45 min [facility] Office [facility]  ? ? ?If their condition worsens, is the pt aware to call PCP or go to the Emergency Dept.? yes ? ?Final Patient Assessment: ?Patient states that's he's doing pretty good. Patient is new to taking medications, which is the biggest hurdle for him and is also experiencing some 'woosiness' about 15 min  after taking his pills in the morning, but it goes away after a few minutes. Blood pressure has been stable. Patient offered mail order service, but is going to wait until after follow up and insurance determination to make a decision on where to get his refills filled.  ? ?Jiles Crocker, PharmD ?Clinical Pharmacist ?[facility] ?08/26/2021 12:33 PM  ?

## 2021-08-27 ENCOUNTER — Encounter: Payer: Self-pay | Admitting: Nurse Practitioner

## 2021-08-27 ENCOUNTER — Ambulatory Visit (INDEPENDENT_AMBULATORY_CARE_PROVIDER_SITE_OTHER): Payer: PRIVATE HEALTH INSURANCE | Admitting: Nurse Practitioner

## 2021-08-27 VITALS — BP 98/68 | HR 67 | Ht 74.0 in | Wt 234.6 lb

## 2021-08-27 DIAGNOSIS — I10 Essential (primary) hypertension: Secondary | ICD-10-CM

## 2021-08-27 DIAGNOSIS — R200 Anesthesia of skin: Secondary | ICD-10-CM

## 2021-08-27 DIAGNOSIS — I255 Ischemic cardiomyopathy: Secondary | ICD-10-CM | POA: Diagnosis not present

## 2021-08-27 DIAGNOSIS — R202 Paresthesia of skin: Secondary | ICD-10-CM

## 2021-08-27 DIAGNOSIS — I2510 Atherosclerotic heart disease of native coronary artery without angina pectoris: Secondary | ICD-10-CM

## 2021-08-27 DIAGNOSIS — E785 Hyperlipidemia, unspecified: Secondary | ICD-10-CM

## 2021-08-27 NOTE — Progress Notes (Signed)
?Cardiology Office Note:   ? ?Date:  08/28/2021  ? ?ID:  Luis Foster, DOB 06/24/1965, MRN [id number] ? ?PCP:  Pcp, No ?  ?[facility] Providers ?Cardiologist:  Early Osmond, MD    ? ?Referring MD: No ref. provider found  ? ?Chief Complaint: follow-up CAD s/p STEMI ? ?History of Present Illness:   ? ?Luis Foster is a 56 y.o. male with a hx of CAD s/p STEMI, cardiogenic shock 2/2 RCA STEMI. hypertension, hyperlipidemia, and tobacco abuse. ? ?On 08/14/2021 he presented with acute onset chest pain onset 1 hour prior to arrival while on the go-cart track.  EKG revealed inferior ST elevations with reciprocal anterior lateral ST depressions consistent with inferior STEMI, developed complete heart block, sinus rate 120 bpm and ventricular rate 50 bpm.  Hs troponin peaked at 17,123, he received atropine and intraprocedural epinephrine for bradycardia arrhythmia.  Temporary pacemaker placed right femoral vein.  Right radial access obtained where cardiac catheterization revealed dLM 40%, nonobstructive LAD/LCx, M RCA 100% occlusion, culprit lesion with moderate thrombus burden in mRCA, dRCA, rPDA, 1 DES to [facility].  Plan for aspirin, ticagrelor 6 to 12 months then aspirin monotherapy. Echo revealed LVEF 40-45%, mild LVH, normal diastolic parameters, entire inferior wall, mid inferoseptal segment are hypokinetic. The entire anterior wall, entire lateral wall, entire anterior septum, and apex are normal. Discharged 08/17/21. ? ?Today, he is here with his wife for hospital follow-up of recent PCI. He reports he is doing well since hospital discharge. Chief complaint is numbness upper left thigh and left wrist present since discharge. He denies injury to this side. 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 He is anxious to return to work. He denies chest pain, dyspnea, palpitations, weakness, diaphoresis. Has some lightheadedness when getting up in the morning, sometimes has to  go lie down after taking medications. This usually resolves within 30 minutes.  No presyncope, syncope. Home SBP in the low 100s. Highest reading 108 over 60-76 mmHg. No bleeding concerns.  ? ?Past Medical History:  ?Diagnosis Date  ? H/O enucleation of right eyeball   ? Hyperlipidemia   ? S/P right coronary artery (RCA) stent placement   ? STEMI involving right coronary artery ([place])   ? ? ?Past Surgical History:  ?Procedure Laterality Date  ? CORONARY STENT INTERVENTION N/A 08/14/2021  ? Procedure: CORONARY STENT INTERVENTION;  Surgeon: Early Osmond, MD;  Location: [place] [facility];  Service: Cardiovascular;  Laterality: N/A;  ? INTRAVASCULAR IMAGING/OCT N/A 08/14/2021  ? Procedure: INTRAVASCULAR IMAGING/OCT;  Surgeon: Early Osmond, MD;  Location: [place] [facility];  Service: Cardiovascular;  Laterality: N/A;  ? LEFT HEART CATH AND CORONARY ANGIOGRAPHY N/A 08/14/2021  ? Procedure: LEFT HEART CATH AND CORONARY ANGIOGRAPHY;  Surgeon: Early Osmond, MD;  Location: [place] [facility];  Service: Cardiovascular;  Laterality: N/A;  ? ? ?Current Medications: ?Current Meds  ?Medication Sig  ? acetaminophen (TYLENOL) 325 MG tablet Take 325 mg by mouth every 6 (six) hours as needed for pain.  ? aspirin 81 MG chewable tablet Chew 1 tablet (81 mg total) by mouth daily.  ? atorvastatin (LIPITOR) 80 MG tablet Take 1 tablet (80 mg total) by mouth daily.  ? losartan (COZAAR) 25 MG tablet Take 1/2 tablet (12.5 mg total) by mouth daily.  ? metoprolol succinate (TOPROL-XL) 25 MG 24 hr tablet Take 1/2 tablets (12.5 mg total) by mouth daily.  ? nitroGLYCERIN (NITROSTAT)  0.4 MG SL tablet Place 1 tablet (0.4 mg total) under the tongue every 5 (five) minutes x 3 doses as needed for chest pain.  ? ticagrelor (BRILINTA) 90 MG TABS tablet Take 1 tablet (90 mg total) by mouth 2 (two) times daily.  ?  ? ?Allergies:   Morphine  ? ?Social History  ? ?Socioeconomic History  ? Marital status: Married  ?  Spouse name: Not on file  ?  Number of children: Not on file  ? Years of education: Not on file  ? Highest education level: Not on file  ?Occupational History  ? Not on file  ?Tobacco Use  ? Smoking status: Former  ?  Types: Cigarettes  ? Smokeless tobacco: Never  ?Substance and Sexual Activity  ? Alcohol use: Not Currently  ?  Comment: moonshine  ? Drug use: Never  ? Sexual activity: Not on file  ?Other Topics Concern  ? Not on file  ?Social History Narrative  ? Not on file  ? ?Social Determinants of Health  ? ?Financial Resource Strain: Not on file  ?Food Insecurity: Not on file  ?Transportation Needs: Not on file  ?Physical Activity: Not on file  ?Stress: Not on file  ?Social Connections: Not on file  ?  ? ?Family History: ?The patient's family history is not on file. He was adopted. ? ?ROS:   ?Please see the history of present illness.    ?+left thigh numbness ?+left wrist numbness ?All other systems reviewed and are negative. ? ?Labs/Other Studies Reviewed:   ? ?The following studies were reviewed today: ? ?Echo 08/15/21 ? ?Left Ventricle: Left ventricular ejection fraction, by estimation, is 40  ?to 45%. The left ventricle has mildly decreased function. The left  ?ventricle demonstrates regional wall motion abnormalities. Definity  ?contrast agent was given IV to delineate the  ?left ventricular endocardial borders. The left ventricular internal cavity  ?size was normal in size. There is mild left ventricular hypertrophy. Left  ?ventricular diastolic parameters were normal.  ?  ?LV Wall Scoring:  ?The entire inferior wall, mid inferoseptal segment, and basal inferoseptal  ?segment are hypokinetic. The entire anterior wall, entire lateral wall,  ?entire anterior septum, and apex are normal.  ?Right Ventricle: The right ventricular size is normal. No increase in  ?right ventricular wall thickness. Right ventricular systolic function is  ?normal. Tricuspid regurgitation signal is inadequate for assessing PA  ?pressure.  ?Left Atrium: Left  atrial size was normal in size.  ?Right Atrium: Right atrial size was normal in size.  ?Pericardium: There is no evidence of pericardial effusion.  ?Mitral Valve: The mitral valve is normal in structure. Trivial miLHC tral  ?valve regurgitation. No evidence of mitral valve stenosis.  ?Tricuspid Valve: The tricuspid valve is normal in structure. Tricuspid  ?valve regurgitation is trivial.  ?Aortic Valve: The aortic valve is tricuspid. Aortic valve regurgitation is  ?not visualized. No aortic stenosis is present.  ?Pulmonic Valve: The pulmonic valve was not well visualized. Pulmonic valve  ?regurgitation is not visualized.  ?Aorta: The aortic root is normal in size and structure.  ?Venous: The inferior vena cava is normal in size with greater than 50%  ?respiratory variability, suggesting right atrial pressure of 3 mmHg.  ?IAS/Shunts: The interatrial septum was not well visualized.  ? ?LHC 08/14/21 ? ?  Mid LM lesion is 10% stenosed. ?  1st Diag lesion is 30% stenosed. ?  Ost LAD lesion is 20% stenosed. ?  Mid RCA lesion is 100% stenosed. ?  A stent was successfully placed. ?  Post intervention, there is a 0% residual stenosis. ?  LV end diastolic pressure is normal. ?  The left ventricular ejection fraction is 50-55% by visual estimate. ?  ?1.  100% mid right coronary artery occlusion treated with 2 overlapping drug-eluting stents with mild disease elsewhere ?2.  Preserved left ventricular ejection fraction with LVEDP of 18 mmHg ?  ?Recommendation: Dual antiplatelet therapy for 1 year and optimal medical therapy. ? ? ?Recent Labs: ?08/15/2021: ALT 21; B Natriuretic Peptide 14.3; Hemoglobin 14.5; Magnesium 1.8; Platelets 235; TSH 1.944 ?08/17/2021: BUN 17; Creatinine, Ser 1.10; Potassium 3.8; Sodium 137  ?Recent Lipid Panel ?   ?Component Value Date/Time  ? CHOL 190 08/15/2021 0148  ? TRIG 48 08/15/2021 0148  ? HDL 40 (L) 08/15/2021 0148  ? CHOLHDL 4.8 08/15/2021 0148  ? VLDL 10 08/15/2021 0148  ? [place] 140 (H)  08/15/2021 0148  ? ? ? ?Risk Assessment/Calculations:   ?  ? ?Physical Exam:   ? ?VS:  BP 98/68 (BP Location: Left Arm, Patient Position: Sitting, Cuff Size: Normal)   Pulse 67   Ht 6\' 2"  (1.88 m)   Wt 234 lb 9.6 oz

## 2021-08-27 NOTE — Patient Instructions (Signed)
Medication Instructions:  ? ?Your physician recommends that you continue on your current medications as directed. Please refer to the Current Medication list given to you today. ? ? ?*If you need a refill on your cardiac medications before your next appointment, please call your pharmacy* ? ? ?Lab Work: ? ?Your physician recommends that you return for a FASTING lipid profile/cmet Monday, May 8. You can come in on the day of your appointment anytime between 7:30-4:30 fasting from midnight the night before.  ? ? ?If you have labs (blood work) drawn today and your tests are completely normal, you will receive your results only by: ?MyChart Message (if you have MyChart) OR ?A paper copy in the mail ?If you have any lab test that is abnormal or we need to change your treatment, we will call you to review the results. ? ? ?Follow-Up: ?At [facility], you and your health needs are our priority.  As part of our continuing mission to provide you with exceptional heart care, we have created designated Provider Care Teams.  These Care Teams include your primary Cardiologist (physician) and Advanced Practice Providers (APPs -  Physician Assistants and Nurse Practitioners) who all work together to provide you with the care you need, when you need it. ? ?We recommend signing up for the patient portal called "MyChart".  Sign up information is provided on this After Visit Summary.  MyChart is used to connect with patients for Virtual Visits (Telemedicine).  Patients are able to view lab/test results, encounter notes, upcoming appointments, etc.  Non-urgent messages can be sent to your provider as well.   ?To learn more about what you can do with MyChart, go to [email or web address].   ? ?Your next appointment:   ?3 month(s) ? ?The format for your next appointment:   ?In Person ? ?Provider:   ?Orbie Pyo, MD   ? ? ?

## 2021-08-28 ENCOUNTER — Encounter: Payer: Self-pay | Admitting: Nurse Practitioner

## 2021-09-03 ENCOUNTER — Encounter: Payer: Self-pay | Admitting: *Deleted

## 2021-09-03 DIAGNOSIS — Z006 Encounter for examination for normal comparison and control in clinical research program: Secondary | ICD-10-CM

## 2021-09-03 NOTE — Research (Addendum)
Spoke to patient, he was at work when call was placed.  Patient asked if we could call back Monday after 5p ? ?09/06/21 ?5:09 PM ? ?Spoke with patient and his wife.  They explained to me that his care will be transferred to [facility] because [facility] is considered out of network for him.  Patient will not enroll in to [place] study ?

## 2021-09-08 ENCOUNTER — Other Ambulatory Visit (HOSPITAL_COMMUNITY): Payer: Self-pay

## 2021-09-24 ENCOUNTER — Telehealth: Payer: Self-pay | Admitting: Internal Medicine

## 2021-09-24 DIAGNOSIS — Z0279 Encounter for issue of other medical certificate: Secondary | ICD-10-CM

## 2021-09-24 NOTE — Telephone Encounter (Signed)
Form+Payment received from patient, placed in [place] box.  ?

## 2021-09-27 ENCOUNTER — Other Ambulatory Visit: Payer: PRIVATE HEALTH INSURANCE

## 2021-10-01 NOTE — Telephone Encounter (Signed)
Form completed and signed by Dr. Lynnette Caffey.  Placed in bin at front desk per protocol. ?

## 2021-10-08 NOTE — Telephone Encounter (Signed)
Form mailed on 10/04/21  Scanned on 10/08/21

## 2021-11-12 ENCOUNTER — Ambulatory Visit: Payer: PRIVATE HEALTH INSURANCE | Admitting: Internal Medicine

## 2021-11-23 NOTE — Progress Notes (Deleted)
Cardiology Office Note:    Date:  11/23/2021   ID:  Luis Foster, DOB 08-30-1965, MRN [id number]  PCP:  Luis Foster   [facility] Providers Cardiologist:  Luis Skeans, MD Referring MD: No ref. provider found   Chief Complaint/Reason for Referral:  Cardiology follow up  ASSESSMENT:    1. ST elevation myocardial infarction involving right coronary artery (HCC)   2. Hyperlipidemia LDL goal <70   3. Hypertension, unspecified type   4. Ischemic cardiomyopathy   5. Tobacco abuse     PLAN:    In order of problems listed above: 1.  Coronary artery disease status post STEMI: We will convert to Plavix with 300 mg load and 75 mg thereafter.  In March of next year we will DC aspirin and continue Plavix monotherapy thereafter.  Continue beta-blocker and statin as well as as needed nitroglycerin. 2.  Hyperlipidemia: Goal LDL is less than 55 given history of myocardial infarction.  We will check lipid panel, LFTs, and lipoprotein little a today. 3.  Hypertension 4.  Ischemic cardiomyopathy: We will check echocardiogram today to look for resolution of wall motion abnormalities and improvement of LV function.  If not improved will start Jardiance 10 mg daily. 5.  Tobacco abuse:          {Are you ordering a CV Procedure (e.g. stress test, cath, DCCV, TEE, etc)?   Press F2        :[id number]}   Dispo:  No follow-ups on file.      Medication Adjustments/Labs and Tests Ordered: Current medicines are reviewed at length with the patient today.  Concerns regarding medicines are outlined above.  The following changes have been made:  {PLAN; NO CHANGE:13088:s}   Labs/tests ordered: No orders of the defined types were placed in this encounter.   Medication Changes: No orders of the defined types were placed in this encounter.    Current medicines are reviewed at length with the patient today.  The patient {ACTIONS; HAS/DOES NOT HAVE:19233} concerns regarding medicines.   History of  Present Illness:    FOCUSED PROBLEM LIST:   1.  ST elevation myocardial infarction status post PCI of the mid right coronary artery with mild disease elsewhere March 2023  2.  Hyperlipidemia 3.  Hypertension 4.  Tobacco abuse 5.  Ischemic cardiomyopathy with an ejection fraction of 40 to 45% on echocardiogram March 2023  The patient is a 56 y.o. male with the indicated medical history here for cardiology follow-up.  He was seen in our office in early April and he was doing well.  There were plans for him to transfer his care to a more convenient hospital system closer to his home.     Current Medications: No outpatient medications have been marked as taking for the 11/26/21 encounter (Appointment) with Orbie Pyo, MD.     Allergies:    Morphine   Social History:   Social History   Tobacco Use   Smoking status: Former    Types: Cigarettes   Smokeless tobacco: Never  Substance Use Topics   Alcohol use: Not Currently    Comment: moonshine   Drug use: Never     Family Hx: Family History  Adopted: Yes     Review of Systems:   Please see the history of present illness.    All other systems reviewed and are negative.     EKGs/Labs/Other Test Reviewed:    EKG:  EKG performed April 2023 that I personally reviewed  demonstrates sinus rhythm with inferior infarction pattern; EKG performed today that I personally reviewed demonstrates ***.  Prior CV studies:  PCI March 2023: 100% occlusion of mid right coronary artery treated with 1 drug-eluting stent with OCT optimization with mild disease elsewhere  TTE March 2023 with ejection fraction of 40 to 45% with inferior and inferolateral hypokinesis and no significant valvular abnormalities  Other studies Reviewed: Review of the additional studies/records demonstrates: ***  Recent Labs: 08/15/2021: ALT 21; B Natriuretic Peptide 14.3; Hemoglobin 14.5; Magnesium 1.8; Platelets 235; TSH 1.944 08/17/2021: BUN 17; Creatinine,  Ser 1.10; Potassium 3.8; Sodium 137   Recent Lipid Panel Lab Results  Component Value Date/Time   CHOL 190 08/15/2021 01:48 AM   TRIG 48 08/15/2021 01:48 AM   HDL 40 (L) 08/15/2021 01:48 AM   LDLCALC 140 (H) 08/15/2021 01:48 AM    Risk Assessment/Calculations:    {Does this patient have ATRIAL FIBRILLATION?:[id number]}      Physical Exam:    VS:  There were no vitals taken for this visit.   Wt Readings from Last 3 Encounters:  08/27/21 234 lb 9.6 oz (106.4 kg)  08/17/21 227 lb 3.2 oz (103.1 kg)    GENERAL:  No apparent distress, AOx3 HEENT:  No carotid bruits, +2 carotid impulses, no scleral icterus CAR: RRR Irregular RR*** no murmurs***, gallops, rubs, or thrills RES:  Clear to auscultation bilaterally ABD:  Soft, nontender, nondistended, positive bowel sounds x 4 VASC:  +2 radial pulses, +2 carotid pulses, palpable pedal pulses NEURO:  CN 2-12 grossly intact; motor and sensory grossly intact PSYCH:  No active depression or anxiety EXT:  No edema, ecchymosis, or cyanosis  Signed, Orbie Pyo, MD  11/23/2021 10:43 AM    [facility] [address] [place], [place], [place]  [address] Phone: [phone]; Fax: [phone]   Note:  This document was prepared using Dragon voice recognition software and may include unintentional dictation errors.

## 2021-11-26 ENCOUNTER — Ambulatory Visit: Payer: PRIVATE HEALTH INSURANCE | Admitting: Internal Medicine

## 2021-11-26 DIAGNOSIS — Z720 Tobacco use: Secondary | ICD-10-CM

## 2021-11-26 DIAGNOSIS — I255 Ischemic cardiomyopathy: Secondary | ICD-10-CM

## 2021-11-26 DIAGNOSIS — E785 Hyperlipidemia, unspecified: Secondary | ICD-10-CM

## 2021-11-26 DIAGNOSIS — I10 Essential (primary) hypertension: Secondary | ICD-10-CM

## 2021-11-26 DIAGNOSIS — I2111 ST elevation (STEMI) myocardial infarction involving right coronary artery: Secondary | ICD-10-CM

## 2022-02-10 ENCOUNTER — Other Ambulatory Visit: Payer: Self-pay | Admitting: Cardiology

## 2022-05-23 ENCOUNTER — Other Ambulatory Visit: Payer: Self-pay | Admitting: Cardiology

## 2022-05-25 MED ORDER — TICAGRELOR 90 MG PO TABS: 90.0000 mg | ORAL_TABLET | Freq: Two times a day (BID) | ORAL | 0 refills | Status: AC

## 2022-12-28 ENCOUNTER — Other Ambulatory Visit: Payer: Self-pay | Admitting: Internal Medicine

## 2023-09-11 ENCOUNTER — Other Ambulatory Visit: Payer: Self-pay | Admitting: Internal Medicine

## 2023-09-13 MED ORDER — ASPIRIN 81 MG PO CHEW: 81.0000 mg | CHEWABLE_TABLET | Freq: Every day | ORAL | 0 refills | Status: AC
